# Patient Record
Sex: Female | Born: 1995 | Hispanic: Yes | Marital: Single | Smoking: Never smoker
Health system: Southern US, Community
[De-identification: ages and names within clinical notes are randomized; demographics above are authoritative.]

## PROBLEM LIST (undated history)

## (undated) HISTORY — PX: NO PAST SURGERIES: SHX2092

---

## 2019-05-20 ENCOUNTER — Other Ambulatory Visit: Payer: Self-pay

## 2019-05-20 DIAGNOSIS — Z20822 Contact with and (suspected) exposure to covid-19: Secondary | ICD-10-CM

## 2019-05-22 LAB — NOVEL CORONAVIRUS, NAA: SARS-CoV-2, NAA: NOT DETECTED

## 2021-08-30 ENCOUNTER — Encounter (HOSPITAL_BASED_OUTPATIENT_CLINIC_OR_DEPARTMENT_OTHER): Payer: Self-pay | Admitting: Urology

## 2021-08-30 ENCOUNTER — Other Ambulatory Visit: Payer: Self-pay

## 2021-08-30 ENCOUNTER — Emergency Department (HOSPITAL_BASED_OUTPATIENT_CLINIC_OR_DEPARTMENT_OTHER): Payer: Self-pay

## 2021-08-30 ENCOUNTER — Emergency Department (HOSPITAL_BASED_OUTPATIENT_CLINIC_OR_DEPARTMENT_OTHER)
Admission: EM | Admit: 2021-08-30 | Discharge: 2021-08-30 | Disposition: A | Discharge status: Home / Self Care | Payer: Self-pay | Attending: Emergency Medicine | Admitting: Emergency Medicine

## 2021-08-30 DIAGNOSIS — J069 Acute upper respiratory infection, unspecified: Secondary | ICD-10-CM | POA: Insufficient documentation

## 2021-08-30 DIAGNOSIS — Z20822 Contact with and (suspected) exposure to covid-19: Secondary | ICD-10-CM | POA: Insufficient documentation

## 2021-08-30 DIAGNOSIS — R091 Pleurisy: Secondary | ICD-10-CM | POA: Insufficient documentation

## 2021-08-30 LAB — BASIC METABOLIC PANEL
Anion gap: 8 (ref 5–15)
BUN: 9 mg/dL (ref 6–20)
CO2: 22 mmol/L (ref 22–32)
Calcium: 9.3 mg/dL (ref 8.9–10.3)
Chloride: 104 mmol/L (ref 98–111)
Creatinine, Ser: 0.83 mg/dL (ref 0.44–1.00)
GFR, Estimated: >60 mL/min (ref >60)
Glucose, Bld: 117 mg/dL — ABNORMAL HIGH (ref 70–99)
Potassium: 4.1 mmol/L (ref 3.5–5.1)
Sodium: 134 mmol/L — ABNORMAL LOW (ref 135–145)

## 2021-08-30 LAB — CBC
HCT: 41.8 % (ref 36.0–46.0)
Hemoglobin: 14.2 g/dL (ref 12.0–15.0)
MCH: 31.3 pg (ref 26.0–34.0)
MCHC: 34 g/dL (ref 30.0–36.0)
MCV: 92.3 fL (ref 80.0–100.0)
Platelets: 274 10*3/uL (ref 150–400)
RBC: 4.53 MIL/uL (ref 3.87–5.11)
RDW: 12.6 % (ref 11.5–15.5)
WBC: 7.7 10*3/uL (ref 4.0–10.5)
nRBC: 0 % (ref 0.0–0.2)

## 2021-08-30 LAB — TROPONIN I (HIGH SENSITIVITY): Troponin I (High Sensitivity): <2 ng/L (ref <18)

## 2021-08-30 LAB — PREGNANCY, URINE: Preg Test, Ur: NEGATIVE

## 2021-08-30 LAB — RESP PANEL BY RT-PCR (FLU A&B, COVID) ARPGX2
Influenza A by PCR: NEGATIVE
Influenza B by PCR: NEGATIVE
SARS Coronavirus 2 by RT PCR: NEGATIVE

## 2021-08-30 MED ORDER — HYDROCODONE BIT-HOMATROP MBR 5-1.5 MG/5ML PO SOLN: 5.0000 mL | Freq: Four times a day (QID) | ORAL | 0 refills | Status: DC | PRN

## 2021-08-30 NOTE — ED Notes (Signed)
Pt in bed, pt states that she is ready to go home, pt verbalized understanding d/c instructions and follow up.

## 2021-08-30 NOTE — ED Triage Notes (Signed)
Chest pain that stated yesterday SOB since Sunday  Productive cough since Sunday  Denies fever

## 2021-08-30 NOTE — Discharge Instructions (Addendum)
As we discussed your symptoms are consistent with upper respiratory infection with pain coming from your cough, and lung inflammation.  There is no evidence of damage to your heart, pneumonia, or other abnormalities in your work-up today.  Your symptoms should improve with rest, ibuprofen and Tylenol for pain, cough syrup, plenty of fluids, and rest.  Please use Tylenol or ibuprofen for pain.  You may use 600 mg ibuprofen every 6 hours or 1000 mg of Tylenol every 6 hours.  You may choose to alternate between the 2.  This would be most effective.  Not to exceed 4 g of Tylenol within 24 hours.  Not to exceed 3200 mg ibuprofen 24 hours.

## 2021-08-30 NOTE — ED Provider Notes (Signed)
[facility] EMERGENCY DEPARTMENT Provider Note   CSN: [id number] Arrival date & time: 08/30/21  1652     History  Chief Complaint  Patient presents with   Chest Pain   Shortness of Breath    Andrea Hogan is a 26 y.o. female with no significant past medical history who presents with complaint of cough, sore throat, chest pain, feeling shortness of breath since Sunday.  Patient reports that she has had a productive cough since Sunday.  She denies any fever, nausea, vomiting.  Patient endorses first-degree family history of ACS.  Patient denies tobacco use, hypertension, hyperlipidemia, diabetes.  Patient denies history of same symptoms.  No personal history of stroke, ACS.  Patient reports that chest pain feels like a pressure on her chest, has been consistent for the last 2 days, worse with cough.  Patient has been taking Sudafed, other over-the-counter cough medication with minimal relief.   Chest Pain Associated symptoms: shortness of breath   Shortness of Breath Associated symptoms: chest pain       Home Medications Prior to Admission medications   Medication Sig Start Date End Date Taking? Authorizing Provider  HYDROcodone bit-homatropine (HYCODAN) 5-1.5 MG/5ML syrup Take 5 mLs by mouth every 6 (six) hours as needed for cough. 08/30/21  Yes Andrea Hogan H, PA-C      Allergies    Penicillins    Review of Systems   Review of Systems  Respiratory:  Positive for chest tightness and shortness of breath.   Cardiovascular:  Positive for chest pain.  All other systems reviewed and are negative.  Physical Exam Updated Vital Signs BP 115/78 (BP Location: Right Arm)    Pulse 88    Temp 98.9 F (37.2 C) (Oral)    Resp 20    Ht 5\' 3"  (1.6 m)    Wt 56.7 kg    LMP 08/07/2021 (Approximate)    SpO2 100%    BMI 22.14 kg/m  Physical Exam Vitals and nursing note reviewed.  Constitutional:      General: She is not in acute distress.    Appearance: Normal appearance.   HENT:     Head: Normocephalic and atraumatic.  Eyes:     General:        Right eye: No discharge.        Left eye: No discharge.  Cardiovascular:     Rate and Rhythm: Normal rate and regular rhythm.     Heart sounds: No murmur heard.   No friction rub. No gallop.  Pulmonary:     Effort: Pulmonary effort is normal.     Breath sounds: Normal breath sounds.     Comments: Clear breath sounds throughout, no wheezing, no stridor, no rhonchi, no rales Chest:     Comments: Very minimal tenderness palpation central chest. Abdominal:     General: Bowel sounds are normal.     Palpations: Abdomen is soft.  Skin:    General: Skin is warm and dry.     Capillary Refill: Capillary refill takes less than 2 seconds.  Neurological:     Mental Status: She is alert and oriented to person, place, and time.  Psychiatric:        Mood and Affect: Mood normal.        Behavior: Behavior normal.    ED Results / Procedures / Treatments   Labs (all labs ordered are listed, but only abnormal results are displayed) Labs Reviewed  BASIC METABOLIC PANEL - Abnormal; Notable for the  following components:      Result Value   Sodium 134 (*)    Glucose, Bld 117 (*)    All other components within normal limits  RESP PANEL BY RT-PCR (FLU A&B, COVID) [id number]  CBC  PREGNANCY, URINE  TROPONIN I (HIGH SENSITIVITY)    EKG EKG Interpretation  Date/Time:  Tuesday August 30 2021 17:06:02 EST Ventricular Rate:  88 PR Interval:  158 QRS Duration: 84 QT Interval:  356 QTC Calculation: 430 R Axis:   101 Text Interpretation: Normal sinus rhythm Rightward axis Borderline ECG No previous ECGs available Confirmed by Aletta Edouard [phone]) on 08/30/2021 5:16:32 PM  Radiology DG Chest 2 View  Result Date: 08/30/2021 CLINICAL DATA:  Chest pain and shortness of breath EXAM: CHEST - 2 VIEW COMPARISON:  None. FINDINGS: The heart size and mediastinal contours are within normal limits. No focal consolidation. No pleural  effusion. No pneumothorax. The visualized skeletal structures are unremarkable. Left breast ornamentation device. IMPRESSION: No active cardiopulmonary disease. Electronically Signed   By: Dahlia Bailiff M.D.   On: 08/30/2021 17:28    Procedures Procedures    Medications Ordered in ED Medications - No data to display  ED Course/ Medical Decision Making/ A&P                           Medical Decision Making Amount and/or Complexity of Data Reviewed Labs: ordered. Radiology: ordered.  Risk Prescription drug management.   Given the large differential diagnosis for [facility], the decision making in this case is of high complexity.  After evaluating all of the data points in this case, the presentation of Andrea Hogan is NOT consistent with Acute Coronary Syndrome (ACS) and/or myocardial ischemia, pulmonary embolism, aortic dissection; Borhaave's, significant arrythmia, pneumothorax, cardiac tamponade, or other emergent cardiopulmonary condition.  Further, the presentation of Andrea Hogan is NOT consistent with pericarditis, myocarditis, cholecystitis, pancreatitis, mediastinitis, endocarditis, new valvular disease.  Additionally, the presentation of Andrea Hogan is NOT consistent with flail chest, cardiac contusion, ARDS, or significant intra-thoracic or intra-abdominal bleeding.  Moreover, this presentation is NOT consistent with pneumonia, sepsis, or pyelonephritis.  The patient has a heart score of 2, negative troponin x1 in context of chest pain has been going on for 6 hours.  Patient has alternative likely diagnosis of upper respiratory infectious symptoms with cough, pleurisy, or chest pain from coughing.  Her EKG shows an underlying rhythm of sinus rhythm, mild with right axis deviation, no evidence of ischemia.  My attending Dr. Melina Copa independently reviewed these EKG findings, agrees with this interpretation.  Independently interpreted the chest x-ray which shows  no evidence of acute cardiopulmonary disease.  A cath radiology interpretation.  I ordered and reviewed lab work including RVP, troponin, CBC, BMP, pregnancy test, the results are overall unremarkable.  She has a very mild hyponatremia, very mild hyperglycemia.  Patient symptoms are consistent with upper respiratory infection, pleurisy, believe she will benefit from supportive care, Hycodan, rest, and close follow-up with PCP.  Patient understands and agrees to this plan.  Strict return and follow-up precautions have been given by me personally or by detailed written instruction given verbally by nursing staff using the teach back method to the patient/family/caregiver(s).  Data Reviewed/Counseling: I have reviewed the patient's vital signs, nursing notes, and other relevant tests/information. I had a detailed discussion regarding the historical points, exam findings, and any diagnostic results supporting the discharge diagnosis. I also discussed the need for outpatient follow-up  and the need to return to the ED if symptoms worsen or if there are any questions or concerns that arise at home.  Final Clinical Impression(s) / ED Diagnoses Final diagnoses:  Viral upper respiratory tract infection  Pleurisy    Rx / DC Orders ED Discharge Orders          Ordered    HYDROcodone bit-homatropine (HYCODAN) 5-1.5 MG/5ML syrup  Every 6 hours PRN        08/30/21 1848              Anselmo Pickler, PA-C 08/30/21 1901    Hayden Rasmussen, MD 08/31/21 [phone]

## 2021-11-28 ENCOUNTER — Emergency Department (HOSPITAL_BASED_OUTPATIENT_CLINIC_OR_DEPARTMENT_OTHER): Payer: Self-pay

## 2021-11-28 ENCOUNTER — Other Ambulatory Visit: Payer: Self-pay

## 2021-11-28 ENCOUNTER — Emergency Department (HOSPITAL_BASED_OUTPATIENT_CLINIC_OR_DEPARTMENT_OTHER)
Admission: EM | Admit: 2021-11-28 | Discharge: 2021-11-29 | Disposition: A | Discharge status: Home / Self Care | Payer: Self-pay | Attending: Emergency Medicine | Admitting: Emergency Medicine

## 2021-11-28 ENCOUNTER — Encounter (HOSPITAL_BASED_OUTPATIENT_CLINIC_OR_DEPARTMENT_OTHER): Payer: Self-pay | Admitting: Emergency Medicine

## 2021-11-28 DIAGNOSIS — R109 Unspecified abdominal pain: Secondary | ICD-10-CM | POA: Insufficient documentation

## 2021-11-28 DIAGNOSIS — O039 Complete or unspecified spontaneous abortion without complication: Secondary | ICD-10-CM | POA: Insufficient documentation

## 2021-11-28 DIAGNOSIS — Z3A22 22 weeks gestation of pregnancy: Secondary | ICD-10-CM | POA: Insufficient documentation

## 2021-11-28 DIAGNOSIS — R112 Nausea with vomiting, unspecified: Secondary | ICD-10-CM

## 2021-11-28 DIAGNOSIS — O219 Vomiting of pregnancy, unspecified: Secondary | ICD-10-CM | POA: Insufficient documentation

## 2021-11-28 LAB — CBC WITH DIFFERENTIAL/PLATELET
Abs Immature Granulocytes: 0.03 10*3/uL (ref 0.00–0.07)
Basophils Absolute: 0 10*3/uL (ref 0.0–0.1)
Basophils Relative: 0 %
Eosinophils Absolute: 0 10*3/uL (ref 0.0–0.5)
Eosinophils Relative: 0 %
HCT: 35.9 % — ABNORMAL LOW (ref 36.0–46.0)
Hemoglobin: 12.5 g/dL (ref 12.0–15.0)
Immature Granulocytes: 0 %
Lymphocytes Relative: 18 %
Lymphs Abs: 1.7 10*3/uL (ref 0.7–4.0)
MCH: 31.6 pg (ref 26.0–34.0)
MCHC: 34.8 g/dL (ref 30.0–36.0)
MCV: 90.7 fL (ref 80.0–100.0)
Monocytes Absolute: 0.8 10*3/uL (ref 0.1–1.0)
Monocytes Relative: 8 %
Neutro Abs: 7.2 10*3/uL (ref 1.7–7.7)
Neutrophils Relative %: 74 %
Platelets: 249 10*3/uL (ref 150–400)
RBC: 3.96 MIL/uL (ref 3.87–5.11)
RDW: 12.7 % (ref 11.5–15.5)
WBC: 9.7 10*3/uL (ref 4.0–10.5)
nRBC: 0 % (ref 0.0–0.2)

## 2021-11-28 LAB — BASIC METABOLIC PANEL
Anion gap: 7 (ref 5–15)
BUN: 9 mg/dL (ref 6–20)
CO2: 22 mmol/L (ref 22–32)
Calcium: 8.7 mg/dL — ABNORMAL LOW (ref 8.9–10.3)
Chloride: 105 mmol/L (ref 98–111)
Creatinine, Ser: 0.55 mg/dL (ref 0.44–1.00)
GFR, Estimated: >60 mL/min (ref >60)
Glucose, Bld: 97 mg/dL (ref 70–99)
Potassium: 3.6 mmol/L (ref 3.5–5.1)
Sodium: 134 mmol/L — ABNORMAL LOW (ref 135–145)

## 2021-11-28 LAB — URINALYSIS, ROUTINE W REFLEX MICROSCOPIC
Bilirubin Urine: NEGATIVE
Glucose, UA: NEGATIVE mg/dL
Hgb urine dipstick: NEGATIVE
Ketones, ur: NEGATIVE mg/dL
Leukocytes,Ua: NEGATIVE
Nitrite: NEGATIVE
Protein, ur: NEGATIVE mg/dL
Specific Gravity, Urine: 1.025 (ref 1.005–1.030)
pH: 7 (ref 5.0–8.0)

## 2021-11-28 LAB — HCG, QUANTITATIVE, PREGNANCY: hCG, Beta Chain, Quant, S: 152073 m[IU]/mL — ABNORMAL HIGH (ref <5)

## 2021-11-28 MED ORDER — UNISOM SLEEPTABS 25 MG PO TABS: 12.5000 mg | ORAL_TABLET | Freq: Every evening | ORAL | 0 refills | Status: DC | PRN

## 2021-11-28 MED ORDER — SODIUM CHLORIDE 0.9 % IV BOLUS
1000.0000 mL | Freq: Once | INTRAVENOUS | Status: AC
  Administered 2021-11-28: 1000 mL via INTRAVENOUS

## 2021-11-28 MED ORDER — ONDANSETRON HCL 4 MG/2ML IJ SOLN
4.0000 mg | Freq: Once | INTRAMUSCULAR | Status: AC
  Administered 2021-11-28: 4 mg via INTRAVENOUS
  Filled 2021-11-28: qty 2

## 2021-11-28 MED ORDER — VITAMIN B-6 25 MG PO TABS: 12.5000 mg | ORAL_TABLET | Freq: Two times a day (BID) | ORAL | 0 refills | Status: DC | PRN

## 2021-11-28 MED ORDER — ONDANSETRON HCL 4 MG PO TABS: 4.0000 mg | ORAL_TABLET | Freq: Four times a day (QID) | ORAL | 0 refills | Status: DC

## 2021-11-28 MED ORDER — ACETAMINOPHEN 325 MG PO TABS
650.0000 mg | ORAL_TABLET | Freq: Once | ORAL | Status: AC
  Administered 2021-11-28: 650 mg via ORAL
  Filled 2021-11-28: qty 2

## 2021-11-28 MED ORDER — DOXYLAMINE SUCCINATE (SLEEP) 25 MG PO TABS
12.5000 mg | ORAL_TABLET | ORAL | Status: DC
Start: 2021-11-28 — End: 2021-11-29
  Filled 2021-11-28: qty 1

## 2021-11-28 MED ORDER — PYRIDOXINE HCL 25 MG PO TABS
25.0000 mg | ORAL_TABLET | Freq: Every day | ORAL | Status: DC
  Filled 2021-11-28: qty 1

## 2021-11-28 NOTE — ED Provider Notes (Signed)
?[facility] EMERGENCY DEPARTMENT ?Provider Note ? ? ?CSN: [id number] ?Arrival date & time: 11/28/21  1754 ? ?  ? ?History ? ?Chief Complaint  ?Patient presents with  ? Emesis During Pregnancy  ? ? ?Andrea Hogan is a 26 y.o. female. ? ?Patient is a 26 yo female G1P1 presenting for abdominal pain. Pt admits to suprapubic abdominal pain with radiation to the back x 3 days. Denies dysuria, hematuria, or increased frequency. Pt also admits nausea and vomiting 5-10 x a day for the last 3 weeks. Denies leakage fluids. No vaginal bleeding. No changes in discharge.  ? ?The history is provided by the patient. No language interpreter was used.  ? ?  ? ?Home Medications ?Prior to Admission medications   ?Medication Sig Start Date End Date Taking? Authorizing Provider  ?doxylamine, Sleep, (UNISOM SLEEPTABS) 25 MG tablet Take 0.5 tablets (12.5 mg total) by mouth at bedtime as needed. [id number]  Yes Campbell Stall P, DO  ?ondansetron (ZOFRAN) 4 MG tablet Take 1 tablet (4 mg total) by mouth every 6 (six) hours. [id number]  Yes Campbell Stall P, DO  ?vitamin B-6 (PYRIDOXINE) 25 MG tablet Take 0.5 tablets (12.5 mg total) by mouth 2 (two) times daily as needed (nausea, vomiting). [id number]  Yes Campbell Stall P, DO  ?HYDROcodone bit-homatropine (HYCODAN) 5-1.5 MG/5ML syrup Take 5 mLs by mouth every 6 (six) hours as needed for cough. 08/30/21   Prosperi, Darrick Meigs H, PA-C  ?   ? ?Allergies    ?Penicillins   ? ?Review of Systems   ?Review of Systems  ?Constitutional:  Negative for chills and fever.  ?HENT:  Negative for ear pain and sore throat.   ?Eyes:  Negative for pain and visual disturbance.  ?Respiratory:  Negative for cough and shortness of breath.   ?Cardiovascular:  Negative for chest pain and palpitations.  ?Gastrointestinal:  Positive for abdominal pain, nausea and vomiting.  ?Genitourinary:  Negative for dysuria and hematuria.  ?Musculoskeletal:  Positive for back pain. Negative for arthralgias.  ?Skin:  Negative for color change  and rash.  ?Neurological:  Negative for seizures and syncope.  ?All other systems reviewed and are negative. ? ?Physical Exam ?Updated Vital Signs ?BP 100/68   Pulse 77   Temp 98.4 ?F (36.9 ?C) (Oral)   Resp 17   Ht 5\' 3"  (1.6 m)   Wt 59 kg   LMP 09/29/2021 (Exact Date)   SpO2 100%   BMI 23.03 kg/m?  ?Physical Exam ?Vitals and nursing note reviewed.  ?Constitutional:   ?   General: She is not in acute distress. ?   Appearance: She is well-developed.  ?HENT:  ?   Head: Normocephalic and atraumatic.  ?Eyes:  ?   Conjunctiva/sclera: Conjunctivae normal.  ?Cardiovascular:  ?   Rate and Rhythm: Normal rate and regular rhythm.  ?   Heart sounds: No murmur heard. ?Pulmonary:  ?   Effort: Pulmonary effort is normal. No respiratory distress.  ?   Breath sounds: Normal breath sounds.  ?Abdominal:  ?   Palpations: Abdomen is soft.  ?   Tenderness: There is no abdominal tenderness.  ?Musculoskeletal:     ?   General: No swelling.  ?   Cervical back: Neck supple.  ?Skin: ?   General: Skin is warm and dry.  ?   Capillary Refill: Capillary refill takes less than 2 seconds.  ?Neurological:  ?   Mental Status: She is alert.  ?Psychiatric:     ?   Mood and  Affect: Mood normal.  ? ? ?ED Results / Procedures / Treatments   ?Labs ?(all labs ordered are listed, but only abnormal results are displayed) ?Labs Reviewed  ?CBC WITH DIFFERENTIAL/PLATELET - Abnormal; Notable for the following components:  ?    Result Value  ? HCT 35.9 (*)   ? All other components within normal limits  ?BASIC METABOLIC PANEL - Abnormal; Notable for the following components:  ? Sodium 134 (*)   ? Calcium 8.7 (*)   ? All other components within normal limits  ?HCG, QUANTITATIVE, PREGNANCY - Abnormal; Notable for the following components:  ? hCG, Beta Chain, Quant, S 152,073 (*)   ? All other components within normal limits  ?URINE CULTURE  ?URINALYSIS, ROUTINE W REFLEX MICROSCOPIC  ? ? ?EKG ?None ? ?Radiology ?US OB LESS THAN 14 WEEKS WITH OB  TRANSVAGINAL ? ?Result Date: 11/28/2021 ?CLINICAL DATA:  Abdominal pain EXAM: OBSTETRIC <14 WK [place] AND TRANSVAGINAL OB US TECHNIQUE: Both transabdominal and transvaginal ultrasound examinations were performed for complete evaluation of the gestation as well as the maternal uterus, adnexal regions, and pelvic cul-de-sac. Transvaginal technique was performed to assess early pregnancy. COMPARISON:  None Available. FINDINGS: Intrauterine gestational sac: Single Yolk sac:  Visualized. Embryo:  Visualized. Cardiac Activity: Not Visualized. Heart Rate:   bpm MSD:   mm    w     d CRL:  15.6 mm   8 w   0 d                  [place] EDC: 07/10/2022 Subchorionic hemorrhage:  Small subchorionic hemorrhage. Maternal uterus/adnexae: No adnexal mass or free fluid. IMPRESSION: Eight week intrauterine pregnancy. No fetal heart tones detected. Findings meet definitive criteria for failed pregnancy. This follows SRU consensus guidelines: Diagnostic Criteria for Nonviable Pregnancy Early in the First Trimester. Alison Stalling J Med [id number]. Electronically Signed   By: Rolm Baptise M.D.   On: 11/28/2021 23:36   ? ?Procedures ?Procedures  ? ? ?Medications Ordered in ED ?Medications  ?pyridOXINE (VITAMIN B-6) tablet 25 mg (25 mg Oral Not Given 11/28/21 2020)  ?doxylamine (Sleep) (UNISOM) tablet 12.5 mg (12.5 mg Oral Not Given 11/28/21 2019)  ?sodium chloride 0.9 % bolus 1,000 mL (0 mLs Intravenous Stopped 11/28/21 2126)  ?ondansetron [facility]) injection 4 mg (4 mg Intravenous Given 11/28/21 2014)  ?acetaminophen (TYLENOL) tablet 650 mg (650 mg Oral Given 11/28/21 2026)  ? ? ?ED Course/ Medical Decision Making/ A&P ?  ?                        ?Medical Decision Making ?Amount and/or Complexity of Data Reviewed ?Labs: ordered. ?Radiology: ordered. ? ?Risk ?OTC drugs. ?Prescription drug management. ? ? ?11:45 PM ?Patient is a 26 year old female, G1, P1, presenting for nausea, vomiting, lower abdominal tightening, and back pain proximately 2 months pregnant.  Is  alert and oriented x3, no acute distress, afebrile, stable vitals.  Abdomen is soft with minimal tenderness to palpation of suprapubic region. ? ?Beta-hCG 152,073 ?UA demonstrates no hematuria.  No UTI.   ?IV fluids and Zofran given for nausea and vomiting.  Stable electrolytes.  ? ?Transvaginal ultrasound demonstrates IUP approximately 8 weeks without fetal heart tones. Findings explained to patient in detail with recommendations for prompt follow up with OBGYN. Unable to perform RH testing at our facility. Pt recommended for testing.  ? ? ?Symptoms likely secondary to first trimester nausea, vomiting, and dehydration.  Patient in no distress and overall  condition improved here in the ED. Detailed discussions were had with the patient regarding current findings, and need for close f/u with OB/GYN.  The patient has been instructed to return immediately if the symptoms worsen in any way for re-evaluation. Patient verbalized understanding and is in agreement with current care plan. All questions answered prior to discharge. ? ? ? ? ? ? ? ?Final Clinical Impression(s) / ED Diagnoses ?Final diagnoses:  ?Nausea and vomiting in pregnancy prior to [redacted] weeks gestation  ?Miscarriage  ? ? ?Rx / DC Orders ?ED Discharge Orders   ? ?      Ordered  ?  doxylamine, Sleep, (UNISOM SLEEPTABS) 25 MG tablet  At bedtime PRN       ? 11/28/21 2154  ?  vitamin B-6 (PYRIDOXINE) 25 MG tablet  2 times daily PRN       ? 11/28/21 2154  ?  ondansetron (ZOFRAN) 4 MG tablet  Every 6 hours       ? 11/28/21 2154  ? ?  ?  ? ?  ? ? ?  ?Lianne Cure, DO ?[id number] ? ?

## 2021-11-28 NOTE — ED Notes (Signed)
Went in with ultrasound tech to chaperone a transvaginal ultrasound. ?

## 2021-11-28 NOTE — ED Triage Notes (Signed)
Pt [redacted] weeks pregnant and for past 3 days had nausea, vomiting, back pain and bilateral leg pain. Denies any injury, denies any bleeding.  ?

## 2021-11-29 ENCOUNTER — Encounter: Payer: Self-pay | Admitting: Family Medicine

## 2021-11-29 ENCOUNTER — Ambulatory Visit (INDEPENDENT_AMBULATORY_CARE_PROVIDER_SITE_OTHER): Payer: Self-pay | Admitting: Family Medicine

## 2021-11-29 VITALS — BP 116/73 | HR 90 | Wt 127.6 lb

## 2021-11-29 DIAGNOSIS — O0289 Other abnormal products of conception: Secondary | ICD-10-CM | POA: Insufficient documentation

## 2021-11-29 MED ORDER — MISOPROSTOL 200 MCG PO TABS: 800.0000 ug | ORAL_TABLET | Freq: Once | ORAL | 0 refills | Status: DC

## 2021-11-29 MED ORDER — OXYCODONE HCL 5 MG PO TABS: 5.0000 mg | ORAL_TABLET | ORAL | 0 refills | Status: DC | PRN

## 2021-11-29 MED ORDER — ONDANSETRON 4 MG PO TBDP: 4.0000 mg | ORAL_TABLET | Freq: Three times a day (TID) | ORAL | 0 refills | Status: DC | PRN

## 2021-11-29 NOTE — Progress Notes (Signed)
Positive GAD7 screening, referral placed for IBH services ? ?Wynona Canes, CMA ? ?

## 2021-11-29 NOTE — Progress Notes (Signed)
? ?GYNECOLOGY OFFICE VISIT NOTE ? ?History:  ? Andrea Hogan is a 26 y.o. G1P0010 here today for nonviable pregnancy follow up. ? ?[facility] ?At that time diagnosed with non-viable pregnancy ?No Rh obtained or sent out ? ?Today reports still having significant belly and back pain, didn't think she could take any pain medicine for it based on her conversation with doctor last night ?No bleeding ?Very upset and has many questions ? ?Health Maintenance Due  ?Topic Date Due  ? COVID-19 Vaccine (1) Never done  ? HPV VACCINES (1 - 2-dose series) Never done  ? HIV Screening  Never done  ? Hepatitis C Screening  Never done  ? TETANUS/TDAP  Never done  ? PAP-Cervical Cytology Screening  Never done  ? PAP SMEAR-Modifier  Never done  ? ? ?History reviewed. No pertinent past medical history. ? ?History reviewed. No pertinent surgical history. ? ?The following portions of the patient's history were reviewed and updated as appropriate: allergies, current medications, past family history, past medical history, past social history, past surgical history and problem list.  ? ?Health Maintenance:   ?Last pap: ?No results found for: DIAGPAP, HPV, HPVHIGH ?*needs* ? ?Last mammogram:  ?N/a  ? ?Review of Systems:  ?Pertinent items noted in HPI and remainder of comprehensive ROS otherwise negative. ? ?Physical Exam:  ?BP 116/73   Pulse 90   Wt 127 lb 9.6 oz (57.9 kg)   BMI 22.60 kg/m?  ?CONSTITUTIONAL: Well-developed, well-nourished female in no acute distress.  ?HEENT:  Normocephalic, atraumatic. External right and left ear normal. No scleral icterus.  ?NECK: Normal range of motion, supple, no masses noted on observation ?SKIN: No rash noted. Not diaphoretic. No erythema. No pallor. ?MUSCULOSKELETAL: Normal range of motion. No edema noted. ?NEUROLOGIC: Alert and oriented to person, place, and time. Normal muscle tone coordination.  ?PSYCHIATRIC: depressed mood and affect ?RESPIRATORY: Effort  normal, no problems with respiration noted ? ?Labs and Imaging ?Results for orders placed or performed during the hospital encounter of 11/28/21 (from the past 168 hour(s))  ?Urinalysis, Routine w reflex microscopic Urine, Clean Catch  ? Collection Time: 11/28/21  8:03 PM  ?Result Value Ref Range  ? Color, Urine YELLOW YELLOW  ? APPearance CLEAR CLEAR  ? Specific Gravity, Urine 1.025 1.005 - 1.030  ? pH 7.0 5.0 - 8.0  ? Glucose, UA NEGATIVE NEGATIVE mg/dL  ? Hgb urine dipstick NEGATIVE NEGATIVE  ? Bilirubin Urine NEGATIVE NEGATIVE  ? Ketones, ur NEGATIVE NEGATIVE mg/dL  ? Protein, ur NEGATIVE NEGATIVE mg/dL  ? Nitrite NEGATIVE NEGATIVE  ? Leukocytes,Ua NEGATIVE NEGATIVE  ?CBC with Differential  ? Collection Time: 11/28/21  8:05 PM  ?Result Value Ref Range  ? WBC 9.7 4.0 - 10.5 K/uL  ? RBC 3.96 3.87 - 5.11 MIL/uL  ? Hemoglobin 12.5 12.0 - 15.0 g/dL  ? HCT 35.9 (L) 36.0 - 46.0 %  ? MCV 90.7 80.0 - 100.0 fL  ? MCH 31.6 26.0 - 34.0 pg  ? MCHC 34.8 30.0 - 36.0 g/dL  ? RDW 12.7 11.5 - 15.5 %  ? Platelets 249 150 - 400 K/uL  ? nRBC 0.0 0.0 - 0.2 %  ? Neutrophils Relative % 74 %  ? Neutro Abs 7.2 1.7 - 7.7 K/uL  ? Lymphocytes Relative 18 %  ? Lymphs Abs 1.7 0.7 - 4.0 K/uL  ? Monocytes Relative 8 %  ? Monocytes Absolute 0.8 0.1 - 1.0 K/uL  ? Eosinophils Relative 0 %  ?  Eosinophils Absolute 0.0 0.0 - 0.5 K/uL  ? Basophils Relative 0 %  ? Basophils Absolute 0.0 0.0 - 0.1 K/uL  ? Immature Granulocytes 0 %  ? Abs Immature Granulocytes 0.03 0.00 - 0.07 K/uL  ?Basic metabolic panel  ? Collection Time: 11/28/21  8:05 PM  ?Result Value Ref Range  ? Sodium 134 (L) 135 - 145 mmol/L  ? Potassium 3.6 3.5 - 5.1 mmol/L  ? Chloride 105 98 - 111 mmol/L  ? CO2 22 22 - 32 mmol/L  ? Glucose, Bld 97 70 - 99 mg/dL  ? BUN 9 6 - 20 mg/dL  ? Creatinine, Ser 0.55 0.44 - 1.00 mg/dL  ? Calcium 8.7 (L) 8.9 - 10.3 mg/dL  ? GFR, Estimated >60 >60 mL/min  ? Anion gap 7 5 - 15  ?hCG, quantitative, pregnancy  ? Collection Time: 11/28/21  8:05 PM  ?Result  Value Ref Range  ? hCG, Beta Chain, Quant, S 152,073 (H) <5 mIU/mL  ? ?US OB LESS THAN 14 WEEKS WITH OB TRANSVAGINAL ? ?Result Date: 11/28/2021 ?CLINICAL DATA:  Abdominal pain EXAM: OBSTETRIC <14 WK [place] AND TRANSVAGINAL OB US TECHNIQUE: Both transabdominal and transvaginal ultrasound examinations were performed for complete evaluation of the gestation as well as the maternal uterus, adnexal regions, and pelvic cul-de-sac. Transvaginal technique was performed to assess early pregnancy. COMPARISON:  None Available. FINDINGS: Intrauterine gestational sac: Single Yolk sac:  Visualized. Embryo:  Visualized. Cardiac Activity: Not Visualized. Heart Rate:   bpm MSD:   mm    w     d CRL:  15.6 mm   8 w   0 d                  [place] EDC: 07/10/2022 Subchorionic hemorrhage:  Small subchorionic hemorrhage. Maternal uterus/adnexae: No adnexal mass or free fluid. IMPRESSION: Eight week intrauterine pregnancy. No fetal heart tones detected. Findings meet definitive criteria for failed pregnancy. This follows SRU consensus guidelines: Diagnostic Criteria for Nonviable Pregnancy Early in the First Trimester. Macy Mis J Med [phone]. Electronically Signed   By: Charlett Nose M.D.   On: 11/28/2021 23:36      ?Assessment and Plan:  ? ?Problem List Items Addressed This Visit   ? ?  ? Other  ? Nonviable pregnancy - Primary  ?  Reassured patient that miscarriage is common with ~1/4 of women experiencing it in their lifetime. Reassured patient that there is nothing she did or did not do to cause this. Reviewed most common reason is presumed to be genetic abnormalities that allow a pregnancy to start but not continue past an early stage, but realistically we do not know the cause in most cases. Reviewed that studies show no definite difference between attempting another pregnancy sooner vs waiting, though some studies do show better live birth outcomes with trying sooner. Reviewed options of expectant, medical, or surgical management. After  counseling she was not sure yet what she wanted to do but was ok with me sending medications for medical management. Rx sent for misoprostol 800 mcg buccal, zofran ODT, and 6 tabs of 5mg  Oxycodone (PDMP reviewed). Reviewed that cramping, bleeding are normal in the first few hours after taking the medication, but should eventually wane. Reviewed warning signs of heavy vaginal bleeding soaking through >1 pad per hour, crescendo abdominal pain, and fever. . Blood type unknown, Abo/Rh sent at this visit. Interested in birth control but not at this time. Declines BH referral at this time.  ? ?Also noted  no pap on file, she reports she had one recently at health department in [facility]. ? ?Will follow up in 1 month to discuss contraception and see how she is doing.  ? ?  ?  ? Relevant Medications  ? misoprostol (CYTOTEC) 200 MCG tablet  ? ondansetron (ZOFRAN-ODT) 4 MG disintegrating tablet  ? oxyCODONE (ROXICODONE) 5 MG immediate release tablet  ? Other Relevant Orders  ? ABO AND RH   ? ? ?Routine preventative health maintenance measures emphasized. ?Please refer to After Visit Summary for other counseling recommendations.  ? ?Return in about 4 weeks (around 12/27/2021) for miscarriage follow up.   ? ?Total face-to-face time with patient: 20 minutes.  Over 50% of encounter was spent on counseling and coordination of care. ? ? ?Venora Maples, MD/MPH ?Attending Family Medicine Physician, Faculty Practice ?Center for [employer], [facility] ? ?

## 2021-11-29 NOTE — Assessment & Plan Note (Addendum)
Reassured patient that miscarriage is common with ~1/4 of women experiencing it in their lifetime. Reassured patient that there is nothing she did or did not do to cause this. Reviewed most common reason is presumed to be genetic abnormalities that allow a pregnancy to start but not continue past an early stage, but realistically we do not know the cause in most cases. Reviewed that studies show no definite difference between attempting another pregnancy sooner vs waiting, though some studies do show better live birth outcomes with trying sooner. Reviewed options of expectant, medical, or surgical management. After counseling she was not sure yet what she wanted to do but was ok with me sending medications for medical management. Rx sent for misoprostol 800 mcg buccal, zofran ODT, and 6 tabs of 5mg  Oxycodone (PDMP reviewed). Reviewed that cramping, bleeding are normal in the first few hours after taking the medication, but should eventually wane. Reviewed warning signs of heavy vaginal bleeding soaking through >1 pad per hour, crescendo abdominal pain, and fever. . Blood type unknown, Abo/Rh sent at this visit. Interested in birth control but not at this time. Declines BH referral at this time.  ? ?Also noted no pap on file, she reports she had one recently at health department in [facility]. ? ?Will follow up in 1 month to discuss contraception and see how she is doing.  ?

## 2021-11-30 LAB — URINE CULTURE: Special Requests: NORMAL

## 2021-11-30 LAB — ABO AND RH: Rh Factor: POSITIVE

## 2021-12-01 ENCOUNTER — Encounter: Payer: Self-pay | Admitting: Family Medicine

## 2021-12-01 ENCOUNTER — Telehealth: Payer: Self-pay | Admitting: Family Medicine

## 2021-12-01 NOTE — Telephone Encounter (Signed)
Patient is calling inquiring about surgery she spoke about with Dr. Crissie Reese ?

## 2021-12-01 NOTE — Telephone Encounter (Signed)
Received a message from Dr. Crissie Reese that he is reaching out the surgery scheduler to schedule the surgery. Called patient and let her know Dr. Audie Clear response. Reviewed with her that Dr. Crissie Reese has checked and there is no availability for surgery tomorrow so will be next week. Reviewed Surgery Scheduler will be the next person to call her and then the hospital will call her to give her further instructions.  ? ?Patient asked if leaving the pregnancy in the body will be harmful to her, reviewed that usually not and that sometimes the body will expel the pregnancy on its own. ? ?Informed patient she can call with any questions or concerns as needed. Reviewed again that if she is saturating pads or pain increases to go to MAU. Patient voiced understanding.  ?

## 2021-12-01 NOTE — Telephone Encounter (Signed)
Called and spoke with patient. Informed her that message that she sent earlier inquiring about D&C was sent to Dr. Dione Plover and awaiting his response. Reviewed it will have to be sent to Surgery Scheduler to be scheduled and patient will be informed.  ? ?Patient reports she is still hurting but pain is decreasing. She reports she chose not to take the Cytotec. She is not bleeding.  ? ?Reviewed if having severe abdominal pain or saturating a pad an hour for more than 2 hours, she is to go to to MAU for evaluation. Patient voiced understanding.  ? ? ?

## 2021-12-02 ENCOUNTER — Other Ambulatory Visit (HOSPITAL_BASED_OUTPATIENT_CLINIC_OR_DEPARTMENT_OTHER): Payer: Self-pay | Admitting: Obstetrics & Gynecology

## 2021-12-02 ENCOUNTER — Telehealth: Payer: Self-pay

## 2021-12-02 DIAGNOSIS — O021 Missed abortion: Secondary | ICD-10-CM

## 2021-12-02 NOTE — Telephone Encounter (Signed)
Called patient, no answer, left voicemail with surgery date, time, location and preop instructions. 

## 2021-12-05 ENCOUNTER — Telehealth: Payer: Self-pay

## 2021-12-05 ENCOUNTER — Other Ambulatory Visit: Payer: Self-pay

## 2021-12-05 ENCOUNTER — Encounter (HOSPITAL_BASED_OUTPATIENT_CLINIC_OR_DEPARTMENT_OTHER): Payer: Self-pay | Admitting: Obstetrics & Gynecology

## 2021-12-05 NOTE — Telephone Encounter (Signed)
Called patient, no answer, left voicemail with updated information about surgery location change and time change. ?

## 2021-12-05 NOTE — Anesthesia Preprocedure Evaluation (Addendum)
Anesthesia Evaluation  ?Patient identified by MRN, date of birth, ID band ?Patient awake ? ? ? ?Reviewed: ?Allergy & Precautions, NPO status , Patient's Chart, lab work & pertinent test results ? ?Airway ?Mallampati: I ? ?TM Distance: >3 FB ?Neck ROM: Full ? ? ? Dental ?no notable dental hx. ?(+) Teeth Intact, Dental Advisory Given ?  ?Pulmonary ?neg pulmonary ROS,  ?  ?Pulmonary exam normal ?breath sounds clear to auscultation ? ? ? ? ? ? Cardiovascular ?Exercise Tolerance: Good ?Normal cardiovascular exam ?Rhythm:Regular Rate:Normal ? ? ?  ?Neuro/Psych ?negative neurological ROS ?   ? GI/Hepatic ?Neg liver ROS,   ?Endo/Other  ?negative endocrine ROS ? Renal/GU ?Lab Results ?     Component                Value               Date                 ?     CREATININE               0.55                11/28/2021           ?     BUN                      9                   11/28/2021           ?     NA                       134 (L)             11/28/2021           ?     K                        3.6                 11/28/2021           ?     CL                       105                 11/28/2021           ?     CO2                      22                  11/28/2021           ?  ? ?  ?Musculoskeletal ? ?(+) Fibromyalgia - ? Abdominal ?  ?Peds ? Hematology ?Lab Results ?     Component                Value               Date                 ?     WBC                      9.7  11/28/2021           ?     HGB                      12.5                11/28/2021           ?     HCT                      35.9 (L)            11/28/2021           ?     MCV                      90.7                11/28/2021           ?     PLT                      249                 11/28/2021           ?   ?Anesthesia Other Findings ?All: PCN ? Reproductive/Obstetrics ? ?  ? ? ? ? ? ? ? ? ? ? ? ? ? ?  ?  ? ? ? ? ? ? ? ?Anesthesia Physical ?Anesthesia Plan ? ?ASA: 2 ? ?Anesthesia Plan: General   ? ?Post-op Pain Management: Toradol IV (intra-op)*, Tylenol PO (pre-op)* and Precedex  ? ?Induction: Intravenous ? ?PONV Risk Score and Plan: Treatment may vary due to age or medical condition, Midazolam, Ondansetron and Dexamethasone ? ?Airway Management Planned: LMA ? ?Additional Equipment: None ? ?Intra-op Plan:  ? ?Post-operative Plan:  ? ?Informed Consent: I have reviewed the patients History and Physical, chart, labs and discussed the procedure including the risks, benefits and alternatives for the proposed anesthesia with the patient or authorized representative who has indicated his/her understanding and acceptance.  ? ? ? ?Dental advisory given ? ?Plan Discussed with:  ? ?Anesthesia Plan Comments:   ? ? ? ? ? ?Anesthesia Quick Evaluation ? ?

## 2021-12-06 ENCOUNTER — Ambulatory Visit (HOSPITAL_BASED_OUTPATIENT_CLINIC_OR_DEPARTMENT_OTHER): Payer: Self-pay | Admitting: Anesthesiology

## 2021-12-06 ENCOUNTER — Encounter (HOSPITAL_BASED_OUTPATIENT_CLINIC_OR_DEPARTMENT_OTHER)
Admission: RE | Disposition: A | Discharge status: Home / Self Care | Payer: Self-pay | Source: Home / Self Care | Attending: Obstetrics & Gynecology

## 2021-12-06 ENCOUNTER — Encounter (HOSPITAL_BASED_OUTPATIENT_CLINIC_OR_DEPARTMENT_OTHER): Payer: Self-pay | Admitting: Obstetrics & Gynecology

## 2021-12-06 ENCOUNTER — Other Ambulatory Visit (HOSPITAL_BASED_OUTPATIENT_CLINIC_OR_DEPARTMENT_OTHER): Payer: Self-pay | Admitting: Obstetrics & Gynecology

## 2021-12-06 ENCOUNTER — Encounter (HOSPITAL_BASED_OUTPATIENT_CLINIC_OR_DEPARTMENT_OTHER)
Admission: RE | Admit: 2021-12-06 | Discharge: 2021-12-06 | Disposition: A | Discharge status: Home / Self Care | Payer: Self-pay | Source: Ambulatory Visit | Attending: Obstetrics & Gynecology | Admitting: Obstetrics & Gynecology

## 2021-12-06 ENCOUNTER — Other Ambulatory Visit: Payer: Self-pay

## 2021-12-06 ENCOUNTER — Ambulatory Visit (HOSPITAL_BASED_OUTPATIENT_CLINIC_OR_DEPARTMENT_OTHER)
Admission: RE | Admit: 2021-12-06 | Discharge: 2021-12-06 | Disposition: A | Discharge status: Home / Self Care | Payer: Self-pay | Attending: Obstetrics & Gynecology | Admitting: Obstetrics & Gynecology

## 2021-12-06 DIAGNOSIS — M797 Fibromyalgia: Secondary | ICD-10-CM | POA: Insufficient documentation

## 2021-12-06 DIAGNOSIS — Z01818 Encounter for other preprocedural examination: Secondary | ICD-10-CM

## 2021-12-06 DIAGNOSIS — O021 Missed abortion: Secondary | ICD-10-CM

## 2021-12-06 HISTORY — PX: DILATION AND EVACUATION: SHX1459

## 2021-12-06 LAB — CBC
HCT: 36.7 % (ref 36.0–46.0)
Hemoglobin: 13.3 g/dL (ref 12.0–15.0)
MCH: 32.6 pg (ref 26.0–34.0)
MCHC: 36.2 g/dL — ABNORMAL HIGH (ref 30.0–36.0)
MCV: 90 fL (ref 80.0–100.0)
Platelets: 258 10*3/uL (ref 150–400)
RBC: 4.08 MIL/uL (ref 3.87–5.11)
RDW: 12.5 % (ref 11.5–15.5)
WBC: 7.6 10*3/uL (ref 4.0–10.5)
nRBC: 0 % (ref 0.0–0.2)

## 2021-12-06 LAB — TYPE AND SCREEN
ABO/RH(D): O POS
Antibody Screen: NEGATIVE

## 2021-12-06 SURGERY — DILATION AND EVACUATION, UTERUS
Anesthesia: General | Site: Uterus

## 2021-12-06 MED ORDER — LIDOCAINE 2% (20 MG/ML) 5 ML SYRINGE
INTRAMUSCULAR | Status: AC
  Filled 2021-12-06: qty 5

## 2021-12-06 MED ORDER — OXYCODONE-ACETAMINOPHEN 5-325 MG PO TABS: 1.0000 | ORAL_TABLET | Freq: Four times a day (QID) | ORAL | 0 refills | Status: DC | PRN

## 2021-12-06 MED ORDER — EPHEDRINE 5 MG/ML INJ
INTRAVENOUS | Status: AC
  Filled 2021-12-06: qty 5

## 2021-12-06 MED ORDER — KETOROLAC TROMETHAMINE 30 MG/ML IJ SOLN: 30.0000 mg | Freq: Once | INTRAMUSCULAR | Status: DC | PRN

## 2021-12-06 MED ORDER — SCOPOLAMINE 1 MG/3DAYS TD PT72
1.0000 | MEDICATED_PATCH | TRANSDERMAL | Status: DC
  Administered 2021-12-06: 1.5 mg via TRANSDERMAL

## 2021-12-06 MED ORDER — ONDANSETRON HCL 4 MG/2ML IJ SOLN
INTRAMUSCULAR | Status: AC
  Filled 2021-12-06: qty 2

## 2021-12-06 MED ORDER — OXYCODONE HCL 5 MG/5ML PO SOLN: 5.0000 mg | Freq: Once | ORAL | Status: AC | PRN

## 2021-12-06 MED ORDER — PHENYLEPHRINE 80 MCG/ML (10ML) SYRINGE FOR IV PUSH (FOR BLOOD PRESSURE SUPPORT)
PREFILLED_SYRINGE | INTRAVENOUS | Status: AC
  Filled 2021-12-06: qty 10

## 2021-12-06 MED ORDER — LIDOCAINE HCL (CARDIAC) PF 100 MG/5ML IV SOSY
PREFILLED_SYRINGE | INTRAVENOUS | Status: DC | PRN
  Administered 2021-12-06: 70 mg via INTRAVENOUS

## 2021-12-06 MED ORDER — SODIUM CHLORIDE 0.9 % IV SOLN
INTRAVENOUS | Status: AC
  Filled 2021-12-06: qty 100

## 2021-12-06 MED ORDER — SODIUM CHLORIDE 0.9 % IV SOLN
100.0000 mg | Freq: Once | INTRAVENOUS | Status: AC
  Administered 2021-12-06: 100 mg via INTRAVENOUS

## 2021-12-06 MED ORDER — LACTATED RINGERS IV SOLN: INTRAVENOUS | Status: DC

## 2021-12-06 MED ORDER — DEXAMETHASONE SODIUM PHOSPHATE 10 MG/ML IJ SOLN
INTRAMUSCULAR | Status: AC
  Filled 2021-12-06: qty 1

## 2021-12-06 MED ORDER — FENTANYL CITRATE (PF) 100 MCG/2ML IJ SOLN
INTRAMUSCULAR | Status: AC
  Filled 2021-12-06: qty 2

## 2021-12-06 MED ORDER — DEXAMETHASONE SODIUM PHOSPHATE 4 MG/ML IJ SOLN
INTRAMUSCULAR | Status: DC | PRN
Start: 2021-12-06 — End: 2021-12-06
  Administered 2021-12-06: 10 mg via INTRAVENOUS

## 2021-12-06 MED ORDER — SILVER NITRATE-POT NITRATE 75-25 % EX MISC
CUTANEOUS | Status: AC
  Filled 2021-12-06: qty 30

## 2021-12-06 MED ORDER — OXYCODONE HCL 5 MG PO TABS
5.0000 mg | ORAL_TABLET | Freq: Once | ORAL | Status: AC | PRN
  Administered 2021-12-06: 5 mg via ORAL

## 2021-12-06 MED ORDER — MIDAZOLAM HCL 2 MG/2ML IJ SOLN
INTRAMUSCULAR | Status: AC
  Filled 2021-12-06: qty 2

## 2021-12-06 MED ORDER — HYDROMORPHONE HCL 1 MG/ML IJ SOLN: 0.2500 mg | INTRAMUSCULAR | Status: DC | PRN

## 2021-12-06 MED ORDER — IBUPROFEN 800 MG PO TABS: 800.0000 mg | ORAL_TABLET | Freq: Three times a day (TID) | ORAL | 0 refills | Status: DC | PRN

## 2021-12-06 MED ORDER — ONDANSETRON HCL 4 MG/2ML IJ SOLN: 4.0000 mg | Freq: Once | INTRAMUSCULAR | Status: DC | PRN

## 2021-12-06 MED ORDER — ATROPINE SULFATE 0.4 MG/ML IV SOLN
INTRAVENOUS | Status: AC
  Filled 2021-12-06: qty 1

## 2021-12-06 MED ORDER — PROPOFOL 10 MG/ML IV BOLUS
INTRAVENOUS | Status: DC | PRN
  Administered 2021-12-06: 170 mg via INTRAVENOUS

## 2021-12-06 MED ORDER — ONDANSETRON HCL 4 MG/2ML IJ SOLN
INTRAMUSCULAR | Status: DC | PRN
  Administered 2021-12-06: 4 mg via INTRAVENOUS

## 2021-12-06 MED ORDER — DEXMEDETOMIDINE (PRECEDEX) IN NS 20 MCG/5ML (4 MCG/ML) IV SYRINGE
PREFILLED_SYRINGE | INTRAVENOUS | Status: DC | PRN
  Administered 2021-12-06: 12 ug via INTRAVENOUS

## 2021-12-06 MED ORDER — ACETAMINOPHEN 500 MG PO TABS
ORAL_TABLET | ORAL | Status: AC
  Filled 2021-12-06: qty 2

## 2021-12-06 MED ORDER — ACETAMINOPHEN 500 MG PO TABS
1000.0000 mg | ORAL_TABLET | Freq: Once | ORAL | Status: AC
  Administered 2021-12-06: 1000 mg via ORAL

## 2021-12-06 MED ORDER — POVIDONE-IODINE 10 % EX SWAB
2.0000 "application " | Freq: Once | CUTANEOUS | Status: AC
  Administered 2021-12-06: 2 via TOPICAL

## 2021-12-06 MED ORDER — SUCCINYLCHOLINE CHLORIDE 200 MG/10ML IV SOSY
PREFILLED_SYRINGE | INTRAVENOUS | Status: AC
  Filled 2021-12-06: qty 10

## 2021-12-06 MED ORDER — OXYCODONE HCL 5 MG PO TABS
ORAL_TABLET | ORAL | Status: AC
  Filled 2021-12-06: qty 1

## 2021-12-06 MED ORDER — FENTANYL CITRATE (PF) 100 MCG/2ML IJ SOLN
INTRAMUSCULAR | Status: DC | PRN
  Administered 2021-12-06: 50 ug via INTRAVENOUS

## 2021-12-06 MED ORDER — MIDAZOLAM HCL 5 MG/5ML IJ SOLN
INTRAMUSCULAR | Status: DC | PRN
  Administered 2021-12-06: 2 mg via INTRAVENOUS

## 2021-12-06 MED ORDER — SCOPOLAMINE 1 MG/3DAYS TD PT72
MEDICATED_PATCH | TRANSDERMAL | Status: AC
  Filled 2021-12-06: qty 1

## 2021-12-06 SURGICAL SUPPLY — 24 items
CATH ROBINSON RED A/P 16FR (CATHETERS) ×2 IMPLANT
DILATOR CANAL MILEX (MISCELLANEOUS) IMPLANT
GAUZE 4X4 16PLY ~~LOC~~+RFID DBL (SPONGE) ×2 IMPLANT
GLOVE BIOGEL M 6.5 STRL (GLOVE) ×4 IMPLANT
GLOVE BIOGEL PI IND STRL 6.5 (GLOVE) ×1 IMPLANT
GLOVE BIOGEL PI IND STRL 7.0 (GLOVE) ×1 IMPLANT
GLOVE BIOGEL PI INDICATOR 6.5 (GLOVE) ×1
GLOVE BIOGEL PI INDICATOR 7.0 (GLOVE) ×1
GOWN STRL REUS W/ TWL LRG LVL3 (GOWN DISPOSABLE) ×2 IMPLANT
GOWN STRL REUS W/TWL LRG LVL3 (GOWN DISPOSABLE) ×4
HIBICLENS CHG 4% 4OZ BTL (MISCELLANEOUS) ×2 IMPLANT
KIT BERKELEY 1ST TRI 3/8 NO TR (MISCELLANEOUS) ×2 IMPLANT
KIT BERKELEY 1ST TRIMESTER 3/8 (MISCELLANEOUS) ×2 IMPLANT
NS IRRIG 1000ML POUR BTL (IV SOLUTION) ×2 IMPLANT
PACK VAGINAL MINOR WOMEN LF (CUSTOM PROCEDURE TRAY) ×2 IMPLANT
PAD OB MATERNITY 4.3X12.25 (PERSONAL CARE ITEMS) ×2 IMPLANT
PAD PREP 24X48 CUFFED NSTRL (MISCELLANEOUS) ×2 IMPLANT
SET BERKELEY SUCTION TUBING (SUCTIONS) ×2 IMPLANT
SPIKE FLUID TRANSFER (MISCELLANEOUS) ×2 IMPLANT
TOWEL GREEN STERILE FF (TOWEL DISPOSABLE) ×4 IMPLANT
VACURETTE 6 ASPIR F TIP BERK (CANNULA) IMPLANT
VACURETTE 7MM CVD STRL WRAP (CANNULA) ×1 IMPLANT
VACURETTE 8 RIGID CVD (CANNULA) IMPLANT
VACURETTE 9 RIGID CVD (CANNULA) IMPLANT

## 2021-12-06 NOTE — Anesthesia Procedure Notes (Signed)
Procedure Name: LMA Insertion ?Date/Time: 12/06/2021 3:29 PM ?Performed by: Thornell Mule, CRNA ?Pre-anesthesia Checklist: Patient identified, Emergency Drugs available, Suction available and Patient being monitored ?Patient Re-evaluated:Patient Re-evaluated prior to induction ?Oxygen Delivery Method: Circle system utilized ?Preoxygenation: Pre-oxygenation with 100% oxygen ?Induction Type: IV induction ?LMA: LMA inserted ?LMA Size: 4.0 ?Number of attempts: 1 ?Placement Confirmation: positive ETCO2 ?Tube secured with: Tape ?Dental Injury: Teeth and Oropharynx as per pre-operative assessment  ? ? ? ? ?

## 2021-12-06 NOTE — H&P (Signed)
Andrea Hogan is an 26 y.o. female. G1 here for suction D&C due to missed abortion.  She has not had any bleeding.  Pt was seen in ER on 11/28/2021 due to pelvic and back pain.  Pregnancy was noted and ultrasound showed [redacted] week gestation without any heartbeat.  Failed pregnancy noted.  Pt has follow up the next day with Dr. Crissie Reese where options were discussed.  Pt has decided to proceed with definitive procedure.  Risks and benefits reviewed with pt today.  Questions answered.  She does want genetic testing done today as well. ?  ? ?History reviewed. No pertinent past medical history. ? ?Past Surgical History:  ?Procedure Laterality Date  ? NO PAST SURGERIES    ? ? ?History reviewed. No pertinent family history. ? ?Social History:  reports that she has never smoked. She has never used smokeless tobacco. She reports that she does not currently use alcohol. She reports that she does not use drugs. ? ?Allergies:  ?Allergies  ?Allergen Reactions  ? Penicillins   ? ? ?Medications Prior to Admission  ?Medication Sig Dispense Refill Last Dose  ? ondansetron (ZOFRAN-ODT) 4 MG disintegrating tablet Take 1 tablet (4 mg total) by mouth every 8 (eight) hours as needed for nausea or vomiting. 20 tablet 0 12/05/2021  ? oxyCODONE (ROXICODONE) 5 MG immediate release tablet Take 1 tablet (5 mg total) by mouth every 4 (four) hours as needed for severe pain. 6 tablet 0 Past Week  ? ? ?Review of Systems  ?All other systems reviewed and are negative. ? ?Blood pressure 109/64, pulse 75, temperature (!) 97.5 ?F (36.4 ?C), temperature source Oral, resp. rate 16, height 5\' 4"  (1.626 m), weight 57.9 kg, last menstrual period 10/02/2021, SpO2 100 %. ?Physical Exam ?Constitutional:   ?   Appearance: Normal appearance.  ?Cardiovascular:  ?   Rate and Rhythm: Normal rate and regular rhythm.  ?Pulmonary:  ?   Effort: Pulmonary effort is normal.  ?   Breath sounds: Normal breath sounds.  ?Neurological:  ?   General: No focal deficit present.  ?    Mental Status: She is alert.  ?Psychiatric:     ?   Mood and Affect: Mood normal.  ? ? ?Results for orders placed or performed during the hospital encounter of 12/06/21 (from the past 24 hour(s))  ?Type and screen     Status: None  ? Collection Time: 12/06/21  9:49 AM  ?Result Value Ref Range  ? ABO/RH(D) O POS   ? Antibody Screen NEG   ? Sample Expiration    ?  12/09/2021,2359 ?Performed at [facility], [address], [place], [place] ?  ?CBC     Status: Abnormal  ? Collection Time: 12/06/21 10:13 AM  ?Result Value Ref Range  ? WBC 7.6 4.0 - 10.5 K/uL  ? RBC 4.08 3.87 - 5.11 MIL/uL  ? Hemoglobin 13.3 12.0 - 15.0 g/dL  ? HCT 36.7 36.0 - 46.0 %  ? MCV 90.0 80.0 - 100.0 fL  ? MCH 32.6 26.0 - 34.0 pg  ? MCHC 36.2 (H) 30.0 - 36.0 g/dL  ? RDW 12.5 11.5 - 15.5 %  ? Platelets 258 150 - 400 K/uL  ? nRBC 0.0 0.0 - 0.2 %  ? ? ?No results found. ? ?Assessment/Plan: ?26 yo G1 with missed ab here for suction D&C and chromosomal testing on fetal tissue.  Questions answered.  Pt ready to proceed. ? ?30 ?12/06/2021, 3:02 PM ? ?

## 2021-12-06 NOTE — Anesthesia Postprocedure Evaluation (Signed)
Anesthesia Post Note ? ?Patient: Andrea Hogan ? ?Procedure(s) Performed: DILATATION AND EVACUATION (Uterus) ?CHROMOSOME STUDIES (Uterus) ? ?  ? ?Patient location during evaluation: PACU ?Anesthesia Type: General ?Level of consciousness: awake and alert ?Pain management: pain level controlled ?Vital Signs Assessment: post-procedure vital signs reviewed and stable ?Respiratory status: spontaneous breathing, nonlabored ventilation, respiratory function stable and patient connected to nasal cannula oxygen ?Cardiovascular status: blood pressure returned to baseline and stable ?Postop Assessment: no apparent nausea or vomiting ?Anesthetic complications: no ? ? ?No notable events documented. ? ?Last Vitals:  ?Vitals:  ? 12/06/21 1622 12/06/21 1643  ?BP: 104/69 130/79  ?Pulse: 80 (!) 106  ?Resp: 17 18  ?Temp:  36.9 ?C  ?SpO2: 98% 95%  ?  ?Last Pain:  ?Vitals:  ? 12/06/21 1643  ?TempSrc: Oral  ?PainSc: 3   ? ? ?  ?  ?  ?  ?  ?  ? ?Trevor Iha ? ? ? ? ?

## 2021-12-06 NOTE — Op Note (Signed)
12/06/2021 ? ?4:13 PM ? ?PATIENT:  Andrea Hogan  26 y.o. female ? ?PRE-OPERATIVE DIAGNOSIS:  MAB ? ?POST-OPERATIVE DIAGNOSIS:  MAB ? ?PROCEDURE:  Procedure(s): ?DILATATION AND EVACUATION ?CHROMOSOME STUDIES ? ?SURGEON:  Jerene Bears ? ?ASSISTANTS: OR staff.   ? ?ANESTHESIA:   general ? ?ESTIMATED BLOOD LOSS: 25 mL ? ?BLOOD ADMINISTERED:none  ? ?FLUIDS: 600cc LR ? ?UOP: pt voided before going back to the operating room ? ?SPECIMEN:  POCs and tissue sent for [facility] testing ? ?DISPOSITION OF SPECIMEN:  PATHOLOGY ? ?FINDINGS: 8-10 week sized retroflexed uterus ? ?DESCRIPTION OF OPERATION: Patient was taken to the operating room.  She is placed in the supine position. SCDs were on her lower extremities and functioning properly. General anesthesia with an LMA was administered without difficulty. Dr. Collins Scotland, anesthesia, oversaw case. ? ?Legs were then placed in the [facility] stirrups in the low lithotomy position. The legs were lifted to the high lithotomy position and the Betadine prep was used on the inner thighs perineum and vagina x3. Patient was draped in a normal standard fashion. Pt voided before going back to the OR so no I&O cath was performed.  A bivalve speculum was placed the vagina. The anterior lip of the cervix was grasped with single-tooth tenaculum.  The endometrial cavity sounded to 12 cm. ? ?The cervix was dilated to a #23 Pratt dilator.  Using a #7 curved suction tip, this was passed to the fundus.  Suction was applied and the tip rotated clockwise.  This was done twice with what appeared to be POCs present.  The suction tip was removed.  A #1 smooth curette was used to ensure there was a rough gritty texture noted throughout the endometrial cavity.  Then one final pass with the suction device was performed.  Minimal bleeding was noted and no additional tissue was obtained.  At this point, the procedure was ended.  The tenaculum was removed.  All instruments were removed from the vagina.  Minimal bleeding  was noted.  ? ?The prep was cleansed of the patient's skin. The legs are positioned back in the supine position. Sponge, lap, needle, initially counts were correct x2. Patient was taken to recovery in stable condition. ? ?COUNTS:  YES ? ?PLAN OF CARE: Transfer to PACU ? ? ? ? ? ? ? ? ? ? ? ? ?  ? ? ?

## 2021-12-06 NOTE — Transfer of Care (Signed)
Immediate Anesthesia Transfer of Care Note ? ?Patient: Jadalyn Oliveri ? ?Procedure(s) Performed: DILATATION AND EVACUATION (Uterus) ?CHROMOSOME STUDIES (Uterus) ? ?Patient Location: PACU ? ?Anesthesia Type:General ? ?Level of Consciousness: drowsy and patient cooperative ? ?Airway & Oxygen Therapy: Patient Spontanous Breathing and Patient connected to face mask oxygen ? ?Post-op Assessment: Report given to RN and Post -op Vital signs reviewed and stable ? ?Post vital signs: Reviewed and stable ? ?Last Vitals:  ?Vitals Value Taken Time  ?BP 103/73 12/06/21 1602  ?Temp    ?Pulse 61 12/06/21 1603  ?Resp 11 12/06/21 1603  ?SpO2 100 % 12/06/21 1603  ?Vitals shown include unvalidated device data. ? ?Last Pain:  ?Vitals:  ? 12/06/21 1230  ?TempSrc: Oral  ?PainSc: 5   ?   ? ?Patients Stated Pain Goal: 5 (12/06/21 1230) ? ?Complications: No notable events documented. ?

## 2021-12-06 NOTE — Discharge Instructions (Addendum)
May take Tylenol after 6:35pm, if needed.  ? ? ?Post Anesthesia Home Care Instructions ? ?Activity: ?Get plenty of rest for the remainder of the day. A responsible individual must stay with you for 24 hours following the procedure.  ?For the next 24 hours, DO NOT: ?-Drive a car ?-Advertising copywriter ?-Drink alcoholic beverages ?-Take any medication unless instructed by your physician ?-Make any legal decisions or sign important papers. ? ?Meals: ?Start with liquid foods such as gelatin or soup. Progress to regular foods as tolerated. Avoid greasy, spicy, heavy foods. If nausea and/or vomiting occur, drink only clear liquids until the nausea and/or vomiting subsides. Call your physician if vomiting continues. ? ?Special Instructions/Symptoms: ?Your throat may feel dry or sore from the anesthesia or the breathing tube placed in your throat during surgery. If this causes discomfort, gargle with warm salt water. The discomfort should disappear within 24 hours. ? ?If you had a scopolamine patch placed behind your ear for the management of post- operative nausea and/or vomiting: ? ?1. The medication in the patch is effective for 72 hours, after which it should be removed.  Wrap patch in a tissue and discard in the trash. Wash hands thoroughly with soap and water. ?2. You may remove the patch earlier than 72 hours if you experience unpleasant side effects which may include dry mouth, dizziness or visual disturbances. ?3. Avoid touching the patch. Wash your hands with soap and water after contact with the patch. ? ?Post-surgical Instructions, Outpatient Surgery ? ?You may expect to feel dizzy, weak, and drowsy for as long as 24 hours after receiving the medicine that made you sleep (anesthetic). For the first 24 hours after your surgery:   ?Do not drive a car, ride a bicycle, participate in physical activities, or take public transportation until you are done taking narcotic pain medicines or as directed by Dr. Hyacinth Meeker.  ?Do  not drink alcohol or take tranquilizers.  ?Do not take medicine that has not been prescribed by your physicians.  ?Do not sign important papers or make important decisions while on narcotic pain medicines.  ?Have a responsible person with you.  ? ?PAIN MANAGEMENT ?Motrin 800mg .  (This is the same as 4-200mg  over the counter tablets of Motrin or ibuprofen.)  You may take this every eight hours or as needed for cramping.   ?Vicodin 5/325mg .  For more severe pain, take one or two tablets every four to six hours as needed for pain control.  (Remember that narcotic pain medications increase your risk of constipation.  If this becomes a problem, you may take an over the counter stool softener like Colace 100mg  up to four times a day.) ? ?DO'S AND DON'T'S ?Do not take a tub bath for two weeks.  You may shower on the first day after your surgery ?Do not do any heavy lifting for one to two weeks.  This increases the chance of bleeding. ?Do move around as you feel able.  Stairs are fine.  You may begin to exercise again as you feel able.  Do not lift any weights for two weeks. ?Do not put anything in the vagina for two weeks--no tampons, intercourse, or douching.   ? ?REGULAR MEDIATIONS/VITAMINS: ?You may restart all of your regular medications as prescribed. ?You may restart all of your vitamins as you normally take them.   ? ?PLEASE CALL OR SEEK MEDICAL CARE IF: ?You have persistent nausea and vomiting.  ?You have trouble eating or drinking.  ?You have an  oral temperature above 100.5.  ?You have constipation that is not helped by adjusting diet or increasing fluid intake. Pain medicines are a common cause of constipation.  ?You have heavy vaginal bleeding ?You have redness or drainage from your incision(s) or there is increasing pain or tenderness near or in the surgical site.  ? ? ?   Oxycodone given at 4:50pm today ?

## 2021-12-07 ENCOUNTER — Encounter (HOSPITAL_BASED_OUTPATIENT_CLINIC_OR_DEPARTMENT_OTHER): Payer: Self-pay | Admitting: *Deleted

## 2021-12-07 ENCOUNTER — Encounter (HOSPITAL_BASED_OUTPATIENT_CLINIC_OR_DEPARTMENT_OTHER): Payer: Self-pay | Admitting: Obstetrics & Gynecology

## 2021-12-07 NOTE — Progress Notes (Signed)
Left message stating courtesy call and if any questions or concerns please call the doctors office.  

## 2021-12-08 LAB — SURGICAL PATHOLOGY

## 2021-12-11 ENCOUNTER — Other Ambulatory Visit: Payer: Self-pay

## 2021-12-11 ENCOUNTER — Encounter (HOSPITAL_BASED_OUTPATIENT_CLINIC_OR_DEPARTMENT_OTHER): Payer: Self-pay | Admitting: Emergency Medicine

## 2021-12-11 ENCOUNTER — Other Ambulatory Visit: Payer: Self-pay | Admitting: Obstetrics and Gynecology

## 2021-12-11 ENCOUNTER — Ambulatory Visit (HOSPITAL_BASED_OUTPATIENT_CLINIC_OR_DEPARTMENT_OTHER)
Admission: EM | Admit: 2021-12-11 | Discharge: 2021-12-12 | Disposition: A | Discharge status: Home / Self Care | Payer: Self-pay | Attending: Emergency Medicine | Admitting: Emergency Medicine

## 2021-12-11 ENCOUNTER — Emergency Department (HOSPITAL_BASED_OUTPATIENT_CLINIC_OR_DEPARTMENT_OTHER): Payer: Self-pay

## 2021-12-11 DIAGNOSIS — O034 Incomplete spontaneous abortion without complication: Secondary | ICD-10-CM | POA: Insufficient documentation

## 2021-12-11 DIAGNOSIS — N939 Abnormal uterine and vaginal bleeding, unspecified: Secondary | ICD-10-CM

## 2021-12-11 DIAGNOSIS — R1030 Lower abdominal pain, unspecified: Secondary | ICD-10-CM

## 2021-12-11 DIAGNOSIS — Z9889 Other specified postprocedural states: Secondary | ICD-10-CM

## 2021-12-11 LAB — COMPREHENSIVE METABOLIC PANEL
ALT: 63 U/L — ABNORMAL HIGH (ref 0–44)
AST: 44 U/L — ABNORMAL HIGH (ref 15–41)
Albumin: 3.8 g/dL (ref 3.5–5.0)
Alkaline Phosphatase: 71 U/L (ref 38–126)
Anion gap: 7 (ref 5–15)
BUN: 8 mg/dL (ref 6–20)
CO2: 22 mmol/L (ref 22–32)
Calcium: 9.4 mg/dL (ref 8.9–10.3)
Chloride: 107 mmol/L (ref 98–111)
Creatinine, Ser: 0.47 mg/dL (ref 0.44–1.00)
GFR, Estimated: >60 mL/min (ref >60)
Glucose, Bld: 104 mg/dL — ABNORMAL HIGH (ref 70–99)
Potassium: 4 mmol/L (ref 3.5–5.1)
Sodium: 136 mmol/L (ref 135–145)
Total Bilirubin: 0.2 mg/dL — ABNORMAL LOW (ref 0.3–1.2)
Total Protein: 7.5 g/dL (ref 6.5–8.1)

## 2021-12-11 LAB — URINALYSIS, ROUTINE W REFLEX MICROSCOPIC
Bilirubin Urine: NEGATIVE
Glucose, UA: NEGATIVE mg/dL
Ketones, ur: NEGATIVE mg/dL
Nitrite: NEGATIVE
Protein, ur: NEGATIVE mg/dL
Specific Gravity, Urine: 1.02 (ref 1.005–1.030)
pH: 7 (ref 5.0–8.0)

## 2021-12-11 LAB — URINALYSIS, MICROSCOPIC (REFLEX)

## 2021-12-11 LAB — CBC WITH DIFFERENTIAL/PLATELET
Abs Immature Granulocytes: 0.03 10*3/uL (ref 0.00–0.07)
Basophils Absolute: 0 10*3/uL (ref 0.0–0.1)
Basophils Relative: 0 %
Eosinophils Absolute: 0.1 10*3/uL (ref 0.0–0.5)
Eosinophils Relative: 1 %
HCT: 35.8 % — ABNORMAL LOW (ref 36.0–46.0)
Hemoglobin: 12.4 g/dL (ref 12.0–15.0)
Immature Granulocytes: 0 %
Lymphocytes Relative: 21 %
Lymphs Abs: 2 10*3/uL (ref 0.7–4.0)
MCH: 31.4 pg (ref 26.0–34.0)
MCHC: 34.6 g/dL (ref 30.0–36.0)
MCV: 90.6 fL (ref 80.0–100.0)
Monocytes Absolute: 0.7 10*3/uL (ref 0.1–1.0)
Monocytes Relative: 8 %
Neutro Abs: 6.4 10*3/uL (ref 1.7–7.7)
Neutrophils Relative %: 70 %
Platelets: 280 10*3/uL (ref 150–400)
RBC: 3.95 MIL/uL (ref 3.87–5.11)
RDW: 12.6 % (ref 11.5–15.5)
WBC: 9.3 10*3/uL (ref 4.0–10.5)
nRBC: 0 % (ref 0.0–0.2)

## 2021-12-11 LAB — HCG, QUANTITATIVE, PREGNANCY: hCG, Beta Chain, Quant, S: 6987 m[IU]/mL — ABNORMAL HIGH (ref <5)

## 2021-12-11 MED ORDER — METRONIDAZOLE 500 MG PO TABS
500.0000 mg | ORAL_TABLET | Freq: Once | ORAL | Status: AC
  Administered 2021-12-11: 500 mg via ORAL
  Filled 2021-12-11: qty 1

## 2021-12-11 MED ORDER — METRONIDAZOLE 500 MG/100ML IV SOLN
500.0000 mg | Freq: Two times a day (BID) | INTRAVENOUS | Status: DC
  Administered 2021-12-11: 500 mg via INTRAVENOUS
  Filled 2021-12-11: qty 100

## 2021-12-11 MED ORDER — ONDANSETRON HCL 4 MG/2ML IJ SOLN
4.0000 mg | Freq: Once | INTRAMUSCULAR | Status: AC
  Administered 2021-12-11: 4 mg via INTRAVENOUS
  Filled 2021-12-11: qty 2

## 2021-12-11 MED ORDER — DOXYCYCLINE HYCLATE 100 MG PO CAPS: 100.0000 mg | ORAL_CAPSULE | Freq: Two times a day (BID) | ORAL | 0 refills | Status: DC

## 2021-12-11 MED ORDER — OXYCODONE HCL 5 MG PO TABS: 5.0000 mg | ORAL_TABLET | ORAL | 0 refills | Status: DC | PRN

## 2021-12-11 MED ORDER — METRONIDAZOLE 500 MG PO TABS: 500.0000 mg | ORAL_TABLET | Freq: Two times a day (BID) | ORAL | 0 refills | Status: DC

## 2021-12-11 MED ORDER — KETOROLAC TROMETHAMINE 60 MG/2ML IM SOLN
60.0000 mg | Freq: Once | INTRAMUSCULAR | Status: AC
  Administered 2021-12-11: 60 mg via INTRAMUSCULAR
  Filled 2021-12-11: qty 2

## 2021-12-11 MED ORDER — DOXYCYCLINE HYCLATE 100 MG PO TABS
100.0000 mg | ORAL_TABLET | Freq: Once | ORAL | Status: AC
  Administered 2021-12-11: 100 mg via ORAL
  Filled 2021-12-11: qty 1

## 2021-12-11 MED ORDER — SODIUM CHLORIDE 0.9 % IV SOLN
100.0000 mg | Freq: Once | INTRAVENOUS | Status: AC
  Administered 2021-12-11: 100 mg via INTRAVENOUS
  Filled 2021-12-11: qty 100

## 2021-12-11 MED ORDER — MORPHINE SULFATE (PF) 4 MG/ML IV SOLN
4.0000 mg | Freq: Once | INTRAVENOUS | Status: AC
  Administered 2021-12-11: 4 mg via INTRAVENOUS
  Filled 2021-12-11: qty 1

## 2021-12-11 MED ORDER — ONDANSETRON 4 MG PO TBDP: 4.0000 mg | ORAL_TABLET | Freq: Three times a day (TID) | ORAL | 0 refills | Status: DC | PRN

## 2021-12-11 MED ORDER — LACTATED RINGERS IV BOLUS
1000.0000 mL | Freq: Once | INTRAVENOUS | Status: AC
  Administered 2021-12-11: 1000 mL via INTRAVENOUS

## 2021-12-11 NOTE — ED Notes (Signed)
Patient transported to Ultrasound 

## 2021-12-11 NOTE — ED Notes (Signed)
Pt had 3 vomiting episodes post zofran administration. Pt unable to tolerate ordered oral antibiotics. MD made aware.

## 2021-12-11 NOTE — Discharge Instructions (Addendum)
   We will discuss your surgery once again in detail at your post-op visit in two to four weeks. If you haven't already done so, please call to make your appointment as soon as possible.  Dilation and Curettage or Vacuum Curettage, Care After These instructions give you information on caring for yourself after your procedure. Your doctor may also give you more specific instructions. Call your doctor if you have any problems or questions after your procedure. HOME CARE Do not drive for 24 hours. Wait 1 week before doing any activities that wear you out. Do not stand for a long time. Limit stair climbing to once or twice a day. Rest often. Continue with your usual diet. Drink enough fluids to keep your pee (urine) clear or pale yellow. If you have a hard time pooping (constipation), you may: Take a medicine to help you go poop (laxative) as told by your doctor. Eat more fruit and bran. Drink more fluids. Take showers, not baths, for as long as told by your doctor. Do not swim or use a hot tub until your doctor says it is okay. Have someone with you for 1day after the procedure. Do not douche, use tampons, or have sex (intercourse) until seen by your doctor Only take medicines as told by your doctor. Do not take aspirin. It can cause bleeding. Keep all doctor visits. GET HELP IF: You have cramps or pain not helped by medicine. You have new pain in the belly (abdomen). You have a bad smelling fluid coming from your vagina. You have a rash. You have problems with any medicine. GET HELP RIGHT AWAY IF:  You start to bleed more than a regular period. You have a fever. You have chest pain. You have trouble breathing. You feel dizzy or feel like passing out (fainting). You pass out. You have pain in the tops of your shoulders. You have vaginal bleeding with or without clumps of blood (blood clots). MAKE SURE YOU: Understand these instructions. Will watch your condition. Will get help  right away if you are not doing well or get worse. Document Released: 04/18/2008 Document Revised: 07/15/2013 Document Reviewed: 02/06/2013 Exit[employer] Patient Information 2015 [employer]. This information is not intended to replace advice given to you by your health care provider. Make sure you discuss any questions you have with your health care provider.   

## 2021-12-11 NOTE — Progress Notes (Addendum)
[facility] ED states patient is nausea and they don't feel she is fine for discharge. Will transfer to [facility] and do ultrasound guided suction d&c due to concern for retained POCs on ultrasound as she does have some blood in the uterine cavity and some vascularity and her Sharene Butters is still 6k from a procedure 5 days ago with a 5/8 quant of 150k; final pathology didn't show any e/o molar pregnancy and anora testing still pending. Pt NPO since 1700. OR front desk called and case posted  Cornelia Copa MD Attending Center for [facility]) GYN Consult Phone: [phone] (M-F, [phone]) & [phone] (Off hours, weekends, holidays) 12/11/2021 Time: 2340

## 2021-12-11 NOTE — ED Triage Notes (Signed)
Had abortion 12/06/2021/ abdominal pain and heavy bleeding .

## 2021-12-11 NOTE — ED Provider Notes (Signed)
[facility] EMERGENCY DEPARTMENT Provider Note   CSN: [id number] Arrival date & time: 12/11/21  1814     History  Chief Complaint  Patient presents with   Abdominal Pain   Vaginal Bleeding    Andrea Hogan is a 26 y.o. female.  HPI     25yo female with history of  D and C 5/16 at [redacted]wk pregnant after missed abortion presents with concern for abdominal pain and vaginal bleeding.   Using padded underwear, super, changed twice today and 4 times last night  Pain started last night around 3AM, lasted about 6 hours, took oxycodone and ibuprofen, didn't help 3PM pain came back, cramping pain, took ibuprofen and not helping Feeling lightheaded No syncope Cramping pain into back Nausea, 2 episodes of emesis No diarrhea/constipation  No fevers   History reviewed. No pertinent past medical history.  Home Medications Prior to Admission medications   Medication Sig Start Date End Date Taking? Authorizing Provider  ondansetron (ZOFRAN-ODT) 4 MG disintegrating tablet Take 1 tablet (4 mg total) by mouth every 8 (eight) hours as needed for nausea or vomiting. 12/11/21  Yes Alvira Monday, MD  oxyCODONE (ROXICODONE) 5 MG immediate release tablet Take 1-2 tablets (5-10 mg total) by mouth every 4 (four) hours as needed for severe pain. 12/11/21  Yes Alvira Monday, MD  ibuprofen (ADVIL) 600 MG tablet Take 1 tablet (600 mg total) by mouth every 8 (eight) hours as needed. 12/12/21   [place] Bing, MD      Allergies    Penicillins    Review of Systems   Review of Systems  Physical Exam Updated Vital Signs BP 118/78   Pulse 78   Temp 97.7 F (36.5 C)   Resp 14   Wt 59 kg   LMP 10/02/2021 (Approximate) Comment: abortion 12/06/21  SpO2 97%   Breastfeeding Unknown   BMI 22.31 kg/m  Physical Exam  ED Results / Procedures / Treatments   Labs (all labs ordered are listed, but only abnormal results are displayed) Labs Reviewed  CBC WITH DIFFERENTIAL/PLATELET -  Abnormal; Notable for the following components:      Result Value   HCT 35.8 (*)    All other components within normal limits  COMPREHENSIVE METABOLIC PANEL - Abnormal; Notable for the following components:   Glucose, Bld 104 (*)    AST 44 (*)    ALT 63 (*)    Total Bilirubin 0.2 (*)    All other components within normal limits  URINALYSIS, ROUTINE W REFLEX MICROSCOPIC - Abnormal; Notable for the following components:   Hgb urine dipstick LARGE (*)    Leukocytes,Ua TRACE (*)    All other components within normal limits  HCG, QUANTITATIVE, PREGNANCY - Abnormal; Notable for the following components:   hCG, Beta Chain, Quant, S 6,987 (*)    All other components within normal limits  URINALYSIS, MICROSCOPIC (REFLEX) - Abnormal; Notable for the following components:   Bacteria, UA FEW (*)    All other components within normal limits  TYPE AND SCREEN  SURGICAL PATHOLOGY    EKG None  Radiology [place] Intraoperative  Result Date: 12/12/2021 CLINICAL DATA:  Ultrasound was provided for use by the ordering physician.  No provider Interpretation or professional fees incurred.    US PELVIC COMPLETE WITH TRANSVAGINAL  Result Date: 12/11/2021 CLINICAL DATA:  Surgical abortion on 12/06/2021.  Vaginal bleeding. EXAM: TRANSABDOMINAL AND TRANSVAGINAL ULTRASOUND OF PELVIS TECHNIQUE: Both transabdominal and transvaginal ultrasound examinations of the pelvis were performed. Transabdominal technique  was performed for global imaging of the pelvis including uterus, ovaries, adnexal regions, and pelvic cul-de-sac. It was necessary to proceed with endovaginal exam following the transabdominal exam to visualize the endometrium and ovaries. COMPARISON:  Pelvic ultrasound dated 11/28/2021. FINDINGS: Uterus Measurements: 9.1 x 4.6 x 4.9 cm = volume: 112 mL. The uterus is retroverted and slightly heterogeneous. Endometrium Thickness: 24 mm. The endometrium is distended. Heterogeneous predominantly hypoechoic content  within the endometrium may represent blood product. Retained product of conception is not excluded correlation with clinical exam and HCG levels recommended. No significant vascularity noted within the endometrial content. Right ovary Not visualized. Left ovary Not visualized. Other findings No abnormal free fluid. IMPRESSION: Distended endometrium containing blood product versus possible retained product of conception. Correlation with clinical exam and HCG levels recommended. Electronically Signed   By: Elgie Collard M.D.   On: 12/11/2021 19:48    Procedures Procedures    Medications Ordered in ED Medications  metroNIDAZOLE (FLAGYL) IVPB 500 mg ( Intravenous Automatically Held 12/27/21 2230)  0.9 %  sodium chloride infusion (has no administration in time range)  fentaNYL (SUBLIMAZE) injection 25-50 mcg (has no administration in time range)  oxyCODONE (Oxy IR/ROXICODONE) 5 MG immediate release tablet (has no administration in time range)  ketorolac (TORADOL) injection 60 mg (60 mg Intramuscular Given 12/11/21 1902)  morphine (PF) 4 MG/ML injection 4 mg (4 mg Intravenous Given 12/11/21 2023)  doxycycline (VIBRA-TABS) tablet 100 mg (100 mg Oral Given 12/11/21 2139)  metroNIDAZOLE (FLAGYL) tablet 500 mg (500 mg Oral Given 12/11/21 2139)  ondansetron (ZOFRAN) injection 4 mg (4 mg Intravenous Given 12/11/21 2139)  doxycycline (VIBRAMYCIN) 100 mg in sodium chloride 0.9 % 250 mL IVPB (100 mg Intravenous New Bag/Given 12/11/21 2346)  lactated ringers bolus 1,000 mL (0 mLs Intravenous Stopped 12/11/21 2345)  ondansetron (ZOFRAN) injection 4 mg (4 mg Intravenous Given 12/11/21 2243)  doxycycline (VIBRAMYCIN) 100 mg in sodium chloride 0.9 % 250 mL IVPB (100 mg Intravenous New Bag/Given 12/12/21 0137)  oxyCODONE (Oxy IR/ROXICODONE) immediate release tablet 5 mg (5 mg Oral Given 12/12/21 0320)    ED Course/ Medical Decision Making/ A&P                           Medical Decision Making Amount and/or Complexity  of Data Reviewed External Data Reviewed: notes. Labs: ordered. Decision-making details documented in ED Course. Radiology: ordered and independent interpretation performed. Decision-making details documented in ED Course.  Risk Prescription drug management.   25yo female with history of  D and C 5/16 at [redacted]wk pregnant after missed abortion presents with concern for abdominal pain and vaginal bleeding.   Hgb normal. Hcg [id number].  Significant pain on pelvic exam.  US shows distended endometrium containing blood and possible retained product of conception.   Concern clinically for retained products given significant pain.  Discussed with Dr. Vergie Living. Given afebrile, hemodynamically stable, initial plan for follow up tomorrow with her OB.  Given doxycycline and flagyl.    Developed nausea and given zofran.  Given po abx however had emesis. Discussed again with Dr. Vergie Living. Given additional zofran, IV fluids, IV abx.  Despite additional zofran she has had continued emesis.  Discussed with Dr. Vergie Living and will take to the OR with concern for retained products of conception.          Final Clinical Impression(s) / ED Diagnoses Final diagnoses:  Vaginal bleeding  Retained products of conception following abortion  Lower abdominal  pain    Rx / DC Orders ED Discharge Orders          Ordered    ibuprofen (ADVIL) 600 MG tablet  Every 8 hours PRN        12/12/21 0219    Discharge patient        12/12/21 0219    doxycycline (VIBRAMYCIN) 100 MG capsule  2 times daily,   Status:  Discontinued        12/11/21 2144    metroNIDAZOLE (FLAGYL) 500 MG tablet  2 times daily,   Status:  Discontinued        12/11/21 2144    ondansetron (ZOFRAN-ODT) 4 MG disintegrating tablet  Every 8 hours PRN        12/11/21 2144    oxyCODONE (ROXICODONE) 5 MG immediate release tablet  Every 4 hours PRN        12/11/21 2144              Alvira MondaySchlossman, Janda Cargo, MD 12/12/21 [phone]

## 2021-12-12 ENCOUNTER — Telehealth (HOSPITAL_BASED_OUTPATIENT_CLINIC_OR_DEPARTMENT_OTHER): Payer: Self-pay | Admitting: *Deleted

## 2021-12-12 ENCOUNTER — Encounter (HOSPITAL_COMMUNITY)
Admission: EM | Disposition: A | Discharge status: Home / Self Care | Payer: Self-pay | Source: Home / Self Care | Attending: Emergency Medicine

## 2021-12-12 ENCOUNTER — Emergency Department (HOSPITAL_COMMUNITY): Payer: Self-pay

## 2021-12-12 ENCOUNTER — Emergency Department (EMERGENCY_DEPARTMENT_HOSPITAL): Payer: Self-pay | Admitting: Registered Nurse

## 2021-12-12 ENCOUNTER — Other Ambulatory Visit: Payer: Self-pay

## 2021-12-12 ENCOUNTER — Emergency Department (HOSPITAL_COMMUNITY): Payer: Self-pay | Admitting: Registered Nurse

## 2021-12-12 ENCOUNTER — Encounter (HOSPITAL_BASED_OUTPATIENT_CLINIC_OR_DEPARTMENT_OTHER): Payer: Self-pay | Admitting: *Deleted

## 2021-12-12 ENCOUNTER — Encounter (HOSPITAL_COMMUNITY): Payer: Self-pay | Admitting: Obstetrics and Gynecology

## 2021-12-12 HISTORY — PX: DILATION AND CURETTAGE OF UTERUS: SHX78

## 2021-12-12 LAB — TYPE AND SCREEN
ABO/RH(D): O POS
Antibody Screen: NEGATIVE

## 2021-12-12 SURGERY — DILATION AND CURETTAGE
Anesthesia: General | Site: Vagina | Laterality: Left

## 2021-12-12 MED ORDER — LIDOCAINE 2% (20 MG/ML) 5 ML SYRINGE
INTRAMUSCULAR | Status: AC
  Filled 2021-12-12: qty 5

## 2021-12-12 MED ORDER — 0.9 % SODIUM CHLORIDE (POUR BTL) OPTIME
TOPICAL | Status: DC | PRN
  Administered 2021-12-12: 1000 mL

## 2021-12-12 MED ORDER — DIPHENHYDRAMINE HCL 50 MG/ML IJ SOLN
INTRAMUSCULAR | Status: DC | PRN
  Administered 2021-12-12: 12.5 mg via INTRAVENOUS

## 2021-12-12 MED ORDER — PROPOFOL 10 MG/ML IV BOLUS
INTRAVENOUS | Status: DC | PRN
  Administered 2021-12-12: 120 mg via INTRAVENOUS

## 2021-12-12 MED ORDER — LIDOCAINE HCL (PF) 1 % IJ SOLN
INTRAMUSCULAR | Status: DC | PRN
  Administered 2021-12-12: 20 mL

## 2021-12-12 MED ORDER — MISOPROSTOL 200 MCG PO TABS: 800.0000 ug | ORAL_TABLET | Freq: Once | ORAL | 0 refills | Status: DC

## 2021-12-12 MED ORDER — LIDOCAINE HCL (PF) 1 % IJ SOLN
INTRAMUSCULAR | Status: AC
  Filled 2021-12-12: qty 30

## 2021-12-12 MED ORDER — FENTANYL CITRATE (PF) 250 MCG/5ML IJ SOLN
INTRAMUSCULAR | Status: DC | PRN
  Administered 2021-12-12: 50 ug via INTRAVENOUS

## 2021-12-12 MED ORDER — KETOROLAC TROMETHAMINE 30 MG/ML IJ SOLN
INTRAMUSCULAR | Status: DC | PRN
  Administered 2021-12-12: 15 mg via INTRAVENOUS

## 2021-12-12 MED ORDER — ONDANSETRON HCL 4 MG/2ML IJ SOLN
INTRAMUSCULAR | Status: DC | PRN
  Administered 2021-12-12: 4 mg via INTRAVENOUS

## 2021-12-12 MED ORDER — OXYCODONE HCL 5 MG PO TABS
5.0000 mg | ORAL_TABLET | Freq: Once | ORAL | Status: AC
  Administered 2021-12-12: 5 mg via ORAL

## 2021-12-12 MED ORDER — MIDAZOLAM HCL 2 MG/2ML IJ SOLN
INTRAMUSCULAR | Status: AC
  Filled 2021-12-12: qty 2

## 2021-12-12 MED ORDER — SODIUM CHLORIDE 0.9 % IV SOLN: INTRAVENOUS | Status: DC

## 2021-12-12 MED ORDER — DIPHENHYDRAMINE HCL 50 MG/ML IJ SOLN
INTRAMUSCULAR | Status: AC
  Filled 2021-12-12: qty 1

## 2021-12-12 MED ORDER — DEXTROSE 5 % IV SOLN: 200.0000 mg | Freq: Once | INTRAVENOUS | Status: DC

## 2021-12-12 MED ORDER — IBUPROFEN 600 MG PO TABS: 600.0000 mg | ORAL_TABLET | Freq: Three times a day (TID) | ORAL | 0 refills | Status: AC | PRN

## 2021-12-12 MED ORDER — DEXAMETHASONE SODIUM PHOSPHATE 10 MG/ML IJ SOLN
INTRAMUSCULAR | Status: AC
  Filled 2021-12-12: qty 1

## 2021-12-12 MED ORDER — SUCCINYLCHOLINE CHLORIDE 200 MG/10ML IV SOSY
PREFILLED_SYRINGE | INTRAVENOUS | Status: AC
  Filled 2021-12-12: qty 10

## 2021-12-12 MED ORDER — FENTANYL CITRATE (PF) 250 MCG/5ML IJ SOLN
INTRAMUSCULAR | Status: AC
  Filled 2021-12-12: qty 5

## 2021-12-12 MED ORDER — SUCCINYLCHOLINE CHLORIDE 200 MG/10ML IV SOSY
PREFILLED_SYRINGE | INTRAVENOUS | Status: DC | PRN
  Administered 2021-12-12: 100 mg via INTRAVENOUS

## 2021-12-12 MED ORDER — SODIUM CHLORIDE 0.9 % IV SOLN
100.0000 mg | Freq: Once | INTRAVENOUS | Status: AC
  Administered 2021-12-12: 100 mg via INTRAVENOUS
  Filled 2021-12-12: qty 100

## 2021-12-12 MED ORDER — KETOROLAC TROMETHAMINE 30 MG/ML IJ SOLN
INTRAMUSCULAR | Status: AC
  Filled 2021-12-12: qty 1

## 2021-12-12 MED ORDER — PROPOFOL 10 MG/ML IV BOLUS
INTRAVENOUS | Status: AC
  Filled 2021-12-12: qty 20

## 2021-12-12 MED ORDER — OXYCODONE HCL 5 MG PO TABS
ORAL_TABLET | ORAL | Status: AC
  Filled 2021-12-12: qty 1

## 2021-12-12 MED ORDER — LACTATED RINGERS IV SOLN: INTRAVENOUS | Status: DC | PRN

## 2021-12-12 MED ORDER — ONDANSETRON HCL 4 MG/2ML IJ SOLN
INTRAMUSCULAR | Status: AC
  Filled 2021-12-12: qty 2

## 2021-12-12 MED ORDER — FENTANYL CITRATE (PF) 100 MCG/2ML IJ SOLN
INTRAMUSCULAR | Status: AC
  Filled 2021-12-12: qty 2

## 2021-12-12 MED ORDER — LIDOCAINE 2% (20 MG/ML) 5 ML SYRINGE
INTRAMUSCULAR | Status: DC | PRN
  Administered 2021-12-12: 60 mg via INTRAVENOUS

## 2021-12-12 MED ORDER — DEXAMETHASONE SODIUM PHOSPHATE 10 MG/ML IJ SOLN
INTRAMUSCULAR | Status: DC | PRN
Start: 2021-12-12 — End: 2021-12-12
  Administered 2021-12-12: 5 mg via INTRAVENOUS

## 2021-12-12 MED ORDER — MIDAZOLAM HCL 2 MG/2ML IJ SOLN
INTRAMUSCULAR | Status: DC | PRN
  Administered 2021-12-12: 2 mg via INTRAVENOUS

## 2021-12-12 MED ORDER — FENTANYL CITRATE (PF) 100 MCG/2ML IJ SOLN: 25.0000 ug | INTRAMUSCULAR | Status: DC | PRN

## 2021-12-12 SURGICAL SUPPLY — 50 items
APPLICATOR COTTON TIP 6 STRL (MISCELLANEOUS) ×1 IMPLANT
APPLICATOR COTTON TIP 6IN STRL (MISCELLANEOUS) ×2 IMPLANT
BLADE SURG 15 STRL LF DISP TIS (BLADE) ×1 IMPLANT
BLADE SURG 15 STRL SS (BLADE)
CABLE HIGH FREQUENCY MONO STRZ (ELECTRODE) IMPLANT
COVER MAYO STAND STRL (DRAPES) ×1 IMPLANT
DEFOGGER SCOPE WARMER CLEARIFY (MISCELLANEOUS) ×1 IMPLANT
DERMABOND ADVANCED (GAUZE/BANDAGES/DRESSINGS)
DERMABOND ADVANCED .7 DNX12 (GAUZE/BANDAGES/DRESSINGS) ×1 IMPLANT
DRAPE HALF SHEET 40X57 (DRAPES) ×1 IMPLANT
DRSG OPSITE POSTOP 3X4 (GAUZE/BANDAGES/DRESSINGS) IMPLANT
DURAPREP 26ML APPLICATOR (WOUND CARE) ×1 IMPLANT
ELECT REM PT RETURN 9FT ADLT (ELECTROSURGICAL)
ELECTRODE REM PT RTRN 9FT ADLT (ELECTROSURGICAL) ×1 IMPLANT
GAUZE 4X4 16PLY ~~LOC~~+RFID DBL (SPONGE) ×1 IMPLANT
GLOVE BIOGEL PI IND STRL 7.0 (GLOVE) ×2 IMPLANT
GLOVE BIOGEL PI IND STRL 7.5 (GLOVE) ×2 IMPLANT
GLOVE BIOGEL PI INDICATOR 7.0 (GLOVE) ×6
GLOVE BIOGEL PI INDICATOR 7.5 (GLOVE) ×2
GLOVE SURG SS PI 7.0 STRL IVOR (GLOVE) ×2 IMPLANT
GOWN STRL REUS W/ TWL LRG LVL3 (GOWN DISPOSABLE) ×3 IMPLANT
GOWN STRL REUS W/TWL LRG LVL3 (GOWN DISPOSABLE) ×3
KIT BASIN OR (CUSTOM PROCEDURE TRAY) ×1 IMPLANT
KIT TURNOVER KIT B (KITS) ×2 IMPLANT
LIGASURE VESSEL 5MM BLUNT TIP (ELECTROSURGICAL) IMPLANT
NDL SPNL 25GX3.5 QUINCKE BL (NEEDLE) IMPLANT
NEEDLE SPNL 25GX3.5 QUINCKE BL (NEEDLE) ×2 IMPLANT
NS IRRIG 1000ML POUR BTL (IV SOLUTION) ×2 IMPLANT
PACK LAPAROSCOPY BASIN (CUSTOM PROCEDURE TRAY) ×1 IMPLANT
PACK LITHOTOMY IV (CUSTOM PROCEDURE TRAY) ×1 IMPLANT
PACK TRENDGUARD 450 HYBRID PRO (MISCELLANEOUS) IMPLANT
PAD OB MATERNITY 4.3X12.25 (PERSONAL CARE ITEMS) ×2 IMPLANT
POUCH LAPAROSCOPIC INSTRUMENT (MISCELLANEOUS) ×2 IMPLANT
POUCH SPECIMEN RETRIEVAL 10MM (ENDOMECHANICALS) IMPLANT
PROTECTOR NERVE ULNAR (MISCELLANEOUS) ×2 IMPLANT
SCISSORS LAP 5X35 DISP (ENDOMECHANICALS) IMPLANT
SET BERKELEY SUCTION TUBING (SUCTIONS) ×1 IMPLANT
SET TUBE SMOKE EVAC HIGH FLOW (TUBING) ×1 IMPLANT
SLEEVE ADV FIXATION 5X100MM (TROCAR) IMPLANT
SPONGE T-LAP 18X18 ~~LOC~~+RFID (SPONGE) ×1 IMPLANT
SUT MON AB 4-0 PS1 27 (SUTURE) ×1 IMPLANT
SUT VICRYL 0 UR6 27IN ABS (SUTURE) ×1 IMPLANT
SYR 10ML LL (SYRINGE) ×1 IMPLANT
SYR CONTROL 10ML LL (SYRINGE) ×1 IMPLANT
TOWEL GREEN STERILE FF (TOWEL DISPOSABLE) ×4 IMPLANT
TRAY FOLEY W/BAG SLVR 14FR (SET/KITS/TRAYS/PACK) ×2 IMPLANT
TRENDGUARD 450 HYBRID PRO PACK (MISCELLANEOUS) ×2
TROCAR ADV FIXATION 5X100MM (TROCAR) IMPLANT
TROCAR BALLN 12MMX100 BLUNT (TROCAR) IMPLANT
WARMER LAPAROSCOPE (MISCELLANEOUS) ×1 IMPLANT

## 2021-12-12 NOTE — H&P (Signed)
Obstetrics & Gynecology H&P   Date of Admission: 12/12/2021   Requesting Provider: [facility] ED  Primary OBGYN: [facility] for [facility] Primary Care Provider: Pcp, No  Reason for Admission: possible retained POCs  History of Present Illness: Andrea Hogan is a 26 y.o. G1P0010 (Patient's last menstrual period was 10/02/2021 (approximate).), with the above CC. PMHx is significant for recent 5/16 suction d&c for missed AB at 8 weeks.  Patient presented to [facility] w/ abdominal pain and bleeding and u/s with ?retained POCs. I was called and felt pt stable for outpatient follow up with her primary GYN and to go home on doxy/flagyl for any potential brewing infection but pt very nauseous and throwing up and ED didn't feel she was stable for d/c to home so I asked them to transfer her here for surgery  Patient with mild belly cramping, no VB and states nausea is better currently  ROS: A 12-point review of systems was performed and negative, except as stated in the above HPI.  OBGYN History: As per HPI.  Past Medical History: History reviewed. No pertinent past medical history.  Past Surgical History: Past Surgical History:  Procedure Laterality Date   DILATION AND EVACUATION N/A 12/06/2021   Procedure: DILATATION AND EVACUATION;  Surgeon: Jerene Bears, MD;  Location: [facility];  Service: Gynecology;  Laterality: N/A;    Family History:  No family history on file.  Social History:  Social History   Socioeconomic History   Marital status: Single    Spouse name: Not on file   Number of children: Not on file   Years of education: Not on file   Highest education level: Not on file  Occupational History   Not on file  Tobacco Use   Smoking status: Never   Smokeless tobacco: Never  Substance and Sexual Activity   Alcohol use: Not Currently    Comment: occ   Drug use: Never   Sexual activity: Yes  Other Topics Concern   Not on file   Social History Narrative   Not on file   Social Determinants of Health   Financial Resource Strain: Not on file  Food Insecurity: Not on file  Transportation Needs: Not on file  Physical Activity: Not on file  Stress: Not on file  Social Connections: Not on file  Intimate Partner Violence: Not on file     Allergy: Allergies  Allergen Reactions   Penicillins     Current Outpatient Medications: Medications Prior to Admission  Medication Sig Dispense Refill Last Dose   ibuprofen (ADVIL) 800 MG tablet Take 1 tablet (800 mg total) by mouth every 8 (eight) hours as needed. 30 tablet 0      Hospital Medications: Current Facility-Administered Medications  Medication Dose Route Frequency Provider Last Rate Last Admin   0.9 %  sodium chloride infusion   Intravenous Continuous Aleesa Sweigert, MD       0.9 % irrigation (POUR BTL)    PRN [place] Bing, MD   1,000 mL at 12/12/21 0043   doxycycline (VIBRAMYCIN) 100 mg in sodium chloride 0.9 % 250 mL IVPB  100 mg Intravenous Once Alvira Monday, MD 125 mL/hr at 12/11/21 2346 100 mg at 12/11/21 2346   doxycycline (VIBRAMYCIN) 100 mg in sodium chloride 0.9 % 250 mL IVPB  100 mg Intravenous Once [place] Bing, MD       [MAR Hold] metroNIDAZOLE (FLAGYL) IVPB 500 mg  500 mg Intravenous Q12H Alvira Monday, MD   Stopped  at 12/11/21 2345     Physical Exam:   Current Vital Signs 24h Vital Sign Ranges  T 98.4 F (36.9 C) Temp  Avg: 98.4 F (36.9 C)  Min: 98.4 F (36.9 C)  Max: 98.4 F (36.9 C)  BP (!) 129/91 BP  Min: 113/69  Max: 129/91  HR 75 Pulse  Avg: 79.7  Min: 71  Max: 88  RR 17 Resp  Avg: 16.5  Min: 16  Max: 17  SaO2 100 % Room Air SpO2  Avg: 99.8 %  Min: 99 %  Max: 100 %       24 Hour I/O Current Shift I/O  Time Ins Outs No intake/output data recorded. No intake/output data recorded.   Patient Vitals for the past 24 hrs:  BP Temp Temp src Pulse Resp SpO2 Weight  12/12/21 0000 (!) 129/91 -- -- 75 17 100 % --   12/11/21 2330 117/78 -- -- 75 16 100 % --  12/11/21 2300 113/69 -- -- 85 -- 100 % --  12/11/21 2230 115/72 -- -- 79 -- 99 % --  12/11/21 2200 121/65 -- -- 85 17 99 % --  12/11/21 2130 118/77 -- -- 82 -- 100 % --  12/11/21 2127 117/77 -- -- 77 16 100 % --  12/11/21 2100 -- -- -- 88 -- 100 % --  12/11/21 2045 -- -- -- 77 -- 100 % --  12/11/21 2030 -- -- -- 84 -- 100 % --  12/11/21 2024 -- -- -- 71 -- 100 % --  12/11/21 2023 115/69 -- -- 71 17 100 % --  12/11/21 1824 117/66 98.4 F (36.9 C) Oral 87 16 100 % --  12/11/21 1819 -- -- -- -- -- -- 59 kg    Body mass index is 22.31 kg/m. General appearance: Well nourished, well developed female in no acute distress.  Neck:  Supple, normal appearance, and no thyromegaly  Cardiovascular: S1, S2 normal, no murmur, rub or gallop, regular rate and rhythm Respiratory:  Clear to auscultation bilateral. Normal respiratory effort Abdomen: soft nttp Neuro/Psych:  Normal mood and affect.  Skin:  Warm and dry.  Extremities: no clubbing, cyanosis, or edema.    Laboratory: Beta HCG: 6987 at 1940 on 5/21  Recent Labs  Lab 12/06/21 1013 12/11/21 1940  WBC 7.6 9.3  HGB 13.3 12.4  HCT 36.7 35.8*  PLT 258 280   Recent Labs  Lab 12/11/21 1940  NA 136  K 4.0  CL 107  CO2 22  BUN 8  CREATININE 0.47  CALCIUM 9.4  PROT 7.5  BILITOT 0.2*  ALKPHOS 71  ALT 63*  AST 44*  GLUCOSE 104*   No results for input(s): APTT, INR, PTT in the last 168 hours.  Invalid input(s): DRHAPTT Recent Labs  Lab 12/06/21 0949  ABORH O POS    Imaging:  Narrative & Impression  CLINICAL DATA:  Surgical abortion on 12/06/2021.  Vaginal bleeding.   EXAM: TRANSABDOMINAL AND TRANSVAGINAL ULTRASOUND OF PELVIS   TECHNIQUE: Both transabdominal and transvaginal ultrasound examinations of the pelvis were performed. Transabdominal technique was performed for global imaging of the pelvis including uterus, ovaries, adnexal regions, and pelvic cul-de-sac. It was  necessary to proceed with endovaginal exam following the transabdominal exam to visualize the endometrium and ovaries.   COMPARISON:  Pelvic ultrasound dated 11/28/2021.   FINDINGS: Uterus   Measurements: 9.1 x 4.6 x 4.9 cm = volume: 112 mL. The uterus is retroverted and slightly heterogeneous.   Endometrium  Thickness: 24 mm. The endometrium is distended. Heterogeneous predominantly hypoechoic content within the endometrium may represent blood product. Retained product of conception is not excluded correlation with clinical exam and HCG levels recommended. No significant vascularity noted within the endometrial content.   Right ovary   Not visualized.   Left ovary   Not visualized.   Other findings   No abnormal free fluid.   IMPRESSION: Distended endometrium containing blood product versus possible retained product of conception. Correlation with clinical exam and HCG levels recommended.     Electronically Signed   By: Elgie Collard M.D.   On: 12/11/2021 19:48      Assessment: Ms. Stefanik is a 26 y.o. G1P0010 with possible retained POCs. Pt stable  Plan: D/w her and she is amenable to suction d&c. Will do via u/s guidance. I told her that she should still be fine for d/c to home   Cornelia Copa MD Attending Center for [facility]) GYN Consult Phone: [phone] (M-F, 0800-1700) & [phone] (Off hours, weekends, holidays)

## 2021-12-12 NOTE — Anesthesia Procedure Notes (Signed)

## 2021-12-12 NOTE — Op Note (Signed)
Operative Note   12/12/2021  PRE-OP DIAGNOSIS: concern for retained products of conception   POST-OP DIAGNOSIS: same  SURGEON: Surgeon(s) and Role:    [place] Bing, MD - Primary  ASSISTANT: None  PROCEDURE:  Suction dilation and curettage via ultrasound guidance  ANESTHESIA: General and paracervical block  ESTIMATED BLOOD LOSS: 75mL  DRAINS: none  TOTAL IV FLUIDS: per anesthesia note  SPECIMENS: products of conception to pathology  VTE PROPHYLAXIS: SCDs to the bilateral lower extremities  ANTIBIOTICS: Doxycycline 100mg  IV x 1 pre op  COMPLICATIONS: none  DISPOSITION: PACU - hemodynamically stable.  CONDITION: stable  BLOOD TYPE: O POS. Rhogam given:n/a  FINDINGS: Old blood and shaggy appearance on ultrasound consistent with what was seen in the ED. Exam under anesthesia revealed 6-8 week sized uterus with no masses and bilateral adnexa without masses or fullness. Large amount of necrotic appearing products of conception were seen, with gritty texture in all four quadrants. Thin, avascular stripe noted at the end of the procedure  PROCEDURE IN DETAIL:  After informed consent was obtained, the patient was taken to the operating room where anesthesia was obtained without difficulty. The patient was positioned in the dorsal lithotomy position in Heeney stirrups. The patient was examined under anesthesia, with the above noted findings.  The bi-valved speculum was placed inside the patient's vagina, and the the anterior lip of the cervix was seen and grasped with the tenaculum.  A paracervical block was achieved with 108mL of 1% lidocaine and then the cervix was already able to pass a #10 cannula. The suction was then calibrated to 30m and connected to the number 10 cannula, which was then introduced with the above noted findings. A gentle curettage was done at the end and yield no products of conception.   The suction was then done one more time to remove any remaining  curettage material.   Excellent hemostasis was noted, and all instruments were removed, with excellent hemostasis noted throughout.  She was then taken out of dorsal lithotomy. The patient tolerated the procedure well.  Sponge, lap and instrument counts were correct x2.  The patient was taken to recovery room in excellent condition.  MD Attending Center for Cornelia Copa [employer])

## 2021-12-12 NOTE — Transfer of Care (Signed)
Immediate Anesthesia Transfer of Care Note  Patient: Andrea Hogan  Procedure(s) Performed: SUCTION DILATATION AND CURETTAGE WITH ULTRASOUND (Left: Vagina )  Patient Location: PACU  Anesthesia Type:General  Level of Consciousness: awake, alert , oriented and drowsy  Airway & Oxygen Therapy: Patient Spontanous Breathing and Patient connected to nasal cannula oxygen  Post-op Assessment: Report given to RN and Post -op Vital signs reviewed and stable  Post vital signs: Reviewed and stable  Last Vitals:  Vitals Value Taken Time  BP 109/79 12/12/21 0233  Temp    Pulse 86 12/12/21 0234  Resp 15 12/12/21 0234  SpO2 95 % 12/12/21 0234  Vitals shown include unvalidated device data.  Last Pain:  Vitals:   12/11/21 2109  TempSrc:   PainSc: 3          Complications: No notable events documented.

## 2021-12-12 NOTE — Progress Notes (Signed)
[facility] Discharge Note  Patient instuctions were given to family. Wound care, diet, pain, follow up care and how and whom to contact with concerns were discussed. Family aware someone needs to remain with patient overnight and concerns after receiving anesthesia and what to avoid and safety. Answered all questions and concerns. Nothing per vagina for 6 weeks. Need to call in morning for follow up appointment.   Discharge paperwork has clear contact informations for surgeon and 24 hour RN line for concerns.   Discussed what concerns to look for including infection and signs/symptoms to look for.   IV was removed prior to discharge. Patient was brought to car with belongings.   Pt exits my care.

## 2021-12-12 NOTE — Progress Notes (Signed)
[facility] Nursing Note  Patient's blood type is O+. She does not need Rhogam.   Lattie Haw RN Pac[facility]

## 2021-12-12 NOTE — Anesthesia Preprocedure Evaluation (Signed)
Anesthesia Evaluation  Patient identified by MRN, date of birth, ID band Patient awake    Reviewed: Allergy & Precautions, H&P , NPO status , Patient's Chart, lab work & pertinent test results  Airway Mallampati: III  TM Distance: >3 FB Neck ROM: Full    Dental no notable dental hx. (+) Teeth Intact, Dental Advisory Given   Pulmonary neg pulmonary ROS,    Pulmonary exam normal breath sounds clear to auscultation       Cardiovascular negative cardio ROS   Rhythm:Regular Rate:Normal     Neuro/Psych negative neurological ROS  negative psych ROS   GI/Hepatic negative GI ROS, Neg liver ROS,   Endo/Other  negative endocrine ROS  Renal/GU negative Renal ROS  negative genitourinary   Musculoskeletal   Abdominal   Peds  Hematology negative hematology ROS (+)   Anesthesia Other Findings   Reproductive/Obstetrics negative OB ROS                             Anesthesia Physical Anesthesia Plan  ASA: 1 and emergent  Anesthesia Plan: General   Post-op Pain Management: Toradol IV (intra-op)*   Induction: Intravenous, Rapid sequence and Cricoid pressure planned  PONV Risk Score and Plan: 4 or greater and Ondansetron, Dexamethasone and Midazolam  Airway Management Planned: Oral ETT  Additional Equipment:   Intra-op Plan:   Post-operative Plan: Extubation in OR  Informed Consent: I have reviewed the patients History and Physical, chart, labs and discussed the procedure including the risks, benefits and alternatives for the proposed anesthesia with the patient or authorized representative who has indicated his/her understanding and acceptance.     Dental advisory given  Plan Discussed with: CRNA  Anesthesia Plan Comments:         Anesthesia Quick Evaluation

## 2021-12-12 NOTE — Telephone Encounter (Signed)
LMOVM for pt to call office 

## 2021-12-13 ENCOUNTER — Ambulatory Visit (INDEPENDENT_AMBULATORY_CARE_PROVIDER_SITE_OTHER): Payer: Self-pay | Admitting: Obstetrics & Gynecology

## 2021-12-13 ENCOUNTER — Encounter (HOSPITAL_COMMUNITY): Payer: Self-pay | Admitting: Obstetrics and Gynecology

## 2021-12-13 VITALS — BP 101/61 | HR 81 | Ht 63.0 in | Wt 129.2 lb

## 2021-12-13 DIAGNOSIS — Z30011 Encounter for initial prescription of contraceptive pills: Secondary | ICD-10-CM

## 2021-12-13 DIAGNOSIS — Z9889 Other specified postprocedural states: Secondary | ICD-10-CM

## 2021-12-13 LAB — SURGICAL PATHOLOGY

## 2021-12-13 MED ORDER — NORETHIN ACE-ETH ESTRAD-FE 1-20 MG-MCG PO TABS: 1.0000 | ORAL_TABLET | Freq: Every day | ORAL | 3 refills | Status: DC

## 2021-12-13 MED ORDER — OXYCODONE-ACETAMINOPHEN 5-325 MG PO TABS: 1.0000 | ORAL_TABLET | Freq: Four times a day (QID) | ORAL | 0 refills | Status: AC | PRN

## 2021-12-13 NOTE — Anesthesia Postprocedure Evaluation (Signed)
Anesthesia Post Note  Patient: Surveyor, quantity  Procedure(s) Performed: SUCTION DILATATION AND CURETTAGE WITH ULTRASOUND (Left: Vagina )     Patient location during evaluation: PACU Anesthesia Type: General Level of consciousness: awake and alert Pain management: pain level controlled Vital Signs Assessment: post-procedure vital signs reviewed and stable Respiratory status: spontaneous breathing, nonlabored ventilation and respiratory function stable Cardiovascular status: blood pressure returned to baseline and stable Postop Assessment: no apparent nausea or vomiting Anesthetic complications: no   No notable events documented.  Last Vitals:  Vitals:   12/12/21 0255 12/12/21 0300  BP:    Pulse: 72 78  Resp: 14 14  Temp:    SpO2: 98% 97%    Last Pain:  Vitals:   12/12/21 0250  TempSrc:   PainSc: 0-No pain                 Syncere Eble,W. EDMOND

## 2021-12-13 NOTE — Patient Instructions (Addendum)
[address] [place], [place] [address]   [phone]

## 2021-12-14 DIAGNOSIS — Z30011 Encounter for initial prescription of contraceptive pills: Secondary | ICD-10-CM | POA: Insufficient documentation

## 2021-12-14 NOTE — Progress Notes (Signed)
GYNECOLOGY  VISIT  CC:   post op recheck  HPI: 26 y.o. G1P0000 Single Other or two or more races female here for recheck after undergoing D&C on 12/06/2021.  She went to the ER over the weekend with increased pain and bleeding.  Possible retained products noted.  She had repeat procedure. Pathology is pending.  Bleeding is minimal now. She is having cramping.  Does not like how pain medication is making her feel.  Had Percocet from last week's procedure.  Has used this.  Would like new prescription.  Also on Doxycycline and flagyl.  Has started taking these.  Denies fever.    We did discuss contraception. She is not interested in having another pregnancy any time soon.  Has not been on anything except Nexplanon.  Did not like the irregular bleeding with this.  Not interested in IUD.  Considering pills.  Different types of pills discussed.  Feels comfortable starting combination OCPs.  Timing for starting discussed.  Risks discussed with pt in detail including DUB, DVT/PE, headache, nausea, increased BP.    Pathology reviewed:  Yes .  Questions answered.    MEDS:   Current Outpatient Medications on File Prior to Visit  Medication Sig Dispense Refill   ibuprofen (ADVIL) 600 MG tablet Take 1 tablet (600 mg total) by mouth every 8 (eight) hours as needed. 10 tablet 0   ondansetron (ZOFRAN-ODT) 4 MG disintegrating tablet Take 1 tablet (4 mg total) by mouth every 8 (eight) hours as needed for nausea or vomiting. 20 tablet 0   No current facility-administered medications on file prior to visit.    SH:  Smoking No    PHYSICAL EXAMINATION:    BP 101/61   Pulse 81   Ht 5\' 3"  (1.6 m)   Wt 129 lb 3.2 oz (58.6 kg)   Breastfeeding No   BMI 22.89 kg/m     General appearance: alert, cooperative and appears stated age CV:  Regular rate and rhythm Lungs:  clear to auscultation, no wheezes, rales or rhonchi, symmetric air entry Abdomen: soft, non-tender; bowel sounds normal; no masses,  no  organomegaly  Assessment/Plan: 1. Post-operative state - recheck 1 month.  Precautions given to call for fever, increased pain, unusual vaginal discharge or other concerns - oxyCODONE-acetaminophen (PERCOCET) 5-325 MG tablet; Take 1-2 tablets by mouth every 6 (six) hours as needed for up to 5 days. use only as much as needed to relieve pain  Dispense: 12 tablet; Refill: 0  2. Encounter for initial prescription of contraceptive pills - after discussion of options, decided to start OCPs.  Will start in 1 week.   - norethindrone-ethinyl estradiol-FE (LOESTRIN FE) 1-20 MG-MCG tablet; Take 1 tablet by mouth daily.  Dispense: 28 tablet; Refill: 3

## 2021-12-28 ENCOUNTER — Encounter: Payer: Self-pay | Admitting: Family Medicine

## 2021-12-28 ENCOUNTER — Ambulatory Visit (INDEPENDENT_AMBULATORY_CARE_PROVIDER_SITE_OTHER): Payer: Self-pay | Admitting: Family Medicine

## 2021-12-28 ENCOUNTER — Encounter: Payer: Self-pay | Admitting: Certified Nurse Midwife

## 2021-12-28 VITALS — BP 116/83 | HR 90 | Ht 63.0 in | Wt 127.5 lb

## 2021-12-28 DIAGNOSIS — O0289 Other abnormal products of conception: Secondary | ICD-10-CM

## 2021-12-28 DIAGNOSIS — Z9889 Other specified postprocedural states: Secondary | ICD-10-CM

## 2021-12-28 NOTE — Progress Notes (Signed)
GYNECOLOGY OFFICE VISIT NOTE  History:   Andrea Hogan is a 26 y.o. G1P0000 here today for follow up.  I saw patient on 11/29/2021, at that time had already received diagnosis of missed AB Decided on medication management during visit, but next day opted for [facility], path c/w POC Had uncomplicated D&C on 12/06/2021, however went back to ED on 12/11/2021 with abdominal pain and fever and US showed concern for ongoing retained POC She was transferred to [facility] and had uncomplicated repeat D&C, path again with some chorionic villi Path benign in both instances  Today reports ongoing intermittent abdominal cramping and intermittent bleeding Also wondering why she needed two procedures Wondering if ongoing symptoms mean something is wrong again Worried she has been losing weight and has no appetite Wondering when [facility] testing will return  Weight: 127 lb 8 oz (57.8 kg)  Wt Readings from Last 3 Encounters:  12/28/21 127 lb 8 oz (57.8 kg)  12/13/21 129 lb 3.2 oz (58.6 kg)  12/11/21 130 lb (59 kg)     Health Maintenance Due  Topic Date Due   COVID-19 Vaccine (1) Never done   HPV VACCINES (1 - 2-dose series) Never done   HIV Screening  Never done   Hepatitis C Screening  Never done   TETANUS/TDAP  Never done   PAP-Cervical Cytology Screening  Never done   PAP SMEAR-Modifier  Never done    No past medical history on file.  Past Surgical History:  Procedure Laterality Date   DILATION AND CURETTAGE OF UTERUS Left 12/12/2021   Procedure: SUCTION DILATATION AND CURETTAGE WITH ULTRASOUND;  Surgeon: Creswell Bing, MD;  Location: [facility] OR;  Service: Gynecology;  Laterality: Left;   DILATION AND EVACUATION N/A 12/06/2021   Procedure: DILATATION AND EVACUATION;  Surgeon: Jerene Bears, MD;  Location: [facility];  Service: Gynecology;  Laterality: N/A;    The following portions of the patient's history were reviewed and updated as appropriate: allergies, current medications, past  family history, past medical history, past social history, past surgical history and problem list.   Health Maintenance:   Last pap: No results found for: "DIAGPAP", "HPV", "HPVHIGH" Reports normal at [facility] last year  Last mammogram:  N/a    Review of Systems:  Pertinent items noted in HPI and remainder of comprehensive ROS otherwise negative.  Physical Exam:  BP 116/83   Pulse 90   Ht 5\' 3"  (1.6 m)   Wt 127 lb 8 oz (57.8 kg)   LMP  (LMP Unknown)   Breastfeeding No   BMI 22.59 kg/m  CONSTITUTIONAL: Well-developed, well-nourished female in no acute distress.  HEENT:  Normocephalic, atraumatic. External right and left ear normal. No scleral icterus.  NECK: Normal range of motion, supple, no masses noted on observation SKIN: No rash noted. Not diaphoretic. No erythema. No pallor. MUSCULOSKELETAL: Normal range of motion. No edema noted. NEUROLOGIC: Alert and oriented to person, place, and time. Normal muscle tone coordination.  PSYCHIATRIC: Normal mood and affect. Normal behavior. Normal judgment and thought content. RESPIRATORY: Effort normal, no problems with respiration noted ABDOMEN: No masses noted. No other overt distention noted.  Nontender to palpation   Labs and Imaging No results found for this or any previous visit (from the past 168 hour(s)). Intraoperative  Result Date: 12/12/2021 CLINICAL DATA:  Ultrasound was provided for use by the ordering physician.  No provider Interpretation or professional fees incurred.    12/14/2021 PELVIC COMPLETE WITH TRANSVAGINAL  Result Date: 12/11/2021  CLINICAL DATA:  Surgical abortion on 12/06/2021.  Vaginal bleeding. EXAM: TRANSABDOMINAL AND TRANSVAGINAL ULTRASOUND OF PELVIS TECHNIQUE: Both transabdominal and transvaginal ultrasound examinations of the pelvis were performed. Transabdominal technique was performed for global imaging of the pelvis including uterus, ovaries, adnexal regions, and pelvic cul-de-sac. It was necessary to proceed  with endovaginal exam following the transabdominal exam to visualize the endometrium and ovaries. COMPARISON:  Pelvic ultrasound dated 11/28/2021. FINDINGS: Uterus Measurements: 9.1 x 4.6 x 4.9 cm = volume: 112 mL. The uterus is retroverted and slightly heterogeneous. Endometrium Thickness: 24 mm. The endometrium is distended. Heterogeneous predominantly hypoechoic content within the endometrium may represent blood product. Retained product of conception is not excluded correlation with clinical exam and HCG levels recommended. No significant vascularity noted within the endometrial content. Right ovary Not visualized. Left ovary Not visualized. Other findings No abnormal free fluid. IMPRESSION: Distended endometrium containing blood product versus possible retained product of conception. Correlation with clinical exam and HCG levels recommended. Electronically Signed   By: Elgie Collard M.D.   On: 12/11/2021 19:48      Assessment and Plan:   Problem List Items Addressed This Visit       Other   Post-operative state    Discussed with patient that ongoing pain and mild vaginal bleeding are common in post op state, especially after having back to back D&C procedures. Reviewed that I do not believe this is indicative of any serious pathology, especially with benign abdominal exam. Also reviewed that need for a second procedure was simply an unforseen complication that could not have been predicted. Encouraged continued use of ibuprofen/tylenol, reviewed weight trend and has only lost max 3 pounds over the past month, reassurance provided. Follow up PRN for issues, she has upcoming appt with Dr. Hyacinth Meeker and I encouraged her to follow up with her. Also wondering about ANORA testing, result not yet in our system, encouraged her to follow up w Dr. Hyacinth Meeker about this as well.       Other Visit Diagnoses     Nonviable pregnancy    -  Primary       Routine preventative health maintenance measures  emphasized. Please refer to After Visit Summary for other counseling recommendations.   No follow-ups on file.    Total face-to-face time with patient: 20 minutes.  Over 50% of encounter was spent on counseling and coordination of care.   Venora Maples, MD/MPH Attending Family Medicine Physician, [facility] for [facility], [facility]

## 2021-12-30 ENCOUNTER — Encounter: Payer: Self-pay | Admitting: Family Medicine

## 2021-12-30 DIAGNOSIS — Z9889 Other specified postprocedural states: Secondary | ICD-10-CM | POA: Insufficient documentation

## 2021-12-30 NOTE — Assessment & Plan Note (Signed)
Discussed with patient that ongoing pain and mild vaginal bleeding are common in post op state, especially after having back to back D&C procedures. Reviewed that I do not believe this is indicative of any serious pathology, especially with benign abdominal exam. Also reviewed that need for a second procedure was simply an unforseen complication that could not have been predicted. Encouraged continued use of ibuprofen/tylenol, reviewed weight trend and has only lost max 3 pounds over the past month, reassurance provided. Follow up PRN for issues, she has upcoming appt with Dr. Sabra Heck and I encouraged her to follow up with her. Also wondering about ANORA testing, result not yet in our system, encouraged her to follow up w Dr. Sabra Heck about this as well.

## 2022-01-09 ENCOUNTER — Encounter (HOSPITAL_BASED_OUTPATIENT_CLINIC_OR_DEPARTMENT_OTHER): Payer: Self-pay | Admitting: Obstetrics & Gynecology

## 2022-01-11 ENCOUNTER — Encounter (HOSPITAL_BASED_OUTPATIENT_CLINIC_OR_DEPARTMENT_OTHER): Payer: Self-pay | Admitting: *Deleted

## 2022-01-16 ENCOUNTER — Telehealth (HOSPITAL_BASED_OUTPATIENT_CLINIC_OR_DEPARTMENT_OTHER): Payer: Self-pay | Admitting: Obstetrics & Gynecology

## 2022-01-16 ENCOUNTER — Encounter (HOSPITAL_BASED_OUTPATIENT_CLINIC_OR_DEPARTMENT_OTHER): Payer: Self-pay | Admitting: Obstetrics & Gynecology

## 2022-01-19 ENCOUNTER — Ambulatory Visit (INDEPENDENT_AMBULATORY_CARE_PROVIDER_SITE_OTHER): Payer: Self-pay | Admitting: Obstetrics & Gynecology

## 2022-01-19 ENCOUNTER — Telehealth (HOSPITAL_BASED_OUTPATIENT_CLINIC_OR_DEPARTMENT_OTHER): Payer: Self-pay

## 2022-01-19 ENCOUNTER — Encounter (HOSPITAL_BASED_OUTPATIENT_CLINIC_OR_DEPARTMENT_OTHER): Payer: Self-pay | Admitting: Obstetrics & Gynecology

## 2022-01-19 VITALS — BP 114/69 | HR 72 | Ht 63.0 in | Wt 129.8 lb

## 2022-01-19 DIAGNOSIS — Z30011 Encounter for initial prescription of contraceptive pills: Secondary | ICD-10-CM

## 2022-01-19 DIAGNOSIS — Z9889 Other specified postprocedural states: Secondary | ICD-10-CM

## 2022-01-19 DIAGNOSIS — Z124 Encounter for screening for malignant neoplasm of cervix: Secondary | ICD-10-CM

## 2022-01-19 DIAGNOSIS — N926 Irregular menstruation, unspecified: Secondary | ICD-10-CM

## 2022-01-19 MED ORDER — NORETHINDRONE ACET-ETHINYL EST 1.5-30 MG-MCG PO TABS: 1.0000 | ORAL_TABLET | Freq: Every day | ORAL | 3 refills | Status: DC

## 2022-01-19 MED ORDER — NORETHIN ACE-ETH ESTRAD-FE 1-20 MG-MCG PO TABS
1.0000 | ORAL_TABLET | Freq: Every day | ORAL | 3 refills | Status: AC
Start: 2022-01-19 — End: ?

## 2022-01-19 NOTE — Telephone Encounter (Signed)
Called pharmacy to cancel recently sent in Rx for Loestrin 1.5mg  per Dr. Hyacinth Meeker. Spoke with Morrie Sheldon who states medication has not been processed through their system yet. She will cancel this medication out once it does process. tbw

## 2022-01-19 NOTE — Progress Notes (Addendum)
GYNECOLOGY  VISIT  CC:   recheck after D&E  HPI: 26 y.o. G1P0000 Single Other or two or more races female here for follow up.  Had D&E due to missed abortion.  Needed repeat procedure due to retained products.  No vaginal discharge.  Pain/cramping from procedure has resolved.  Did start OCP and is on cycle now.  Had some irregular bleeding with first pack.  Does have RFs.  Recommended that she continue with current OCP and let me know if have irregular spotting with next month or two.  Would recommending changing Rx after another month or two if irregular spotting continues.  She is having some un-related hip pain.  Quit her job because she felt this was interfering and possibly no accomodation could be made for pt.  She has recently requested additional pain medication for this. Advised should seek additional evaluation for this.    Has not applied for Medicaid yet but does have the documentation she needs.  Has an appt today.  Asked pt to let us know if needs anything else after appt.  Pt's history reviewed.  No pap smear results in Epic or in Care Everywhere.  Pt thinks she had one.  Recommended obtaining pap smear today and requesting records.  Can discard if has been done.  Pt comfortable with plan.    No past medical history on file.  MEDS:   Current Outpatient Medications on File Prior to Visit  Medication Sig Dispense Refill   ibuprofen (ADVIL) 600 MG tablet Take 1 tablet (600 mg total) by mouth every 8 (eight) hours as needed. 10 tablet 0   norethindrone-ethinyl estradiol-FE (LOESTRIN FE) 1-20 MG-MCG tablet Take 1 tablet by mouth daily. 28 tablet 3   No current facility-administered medications on file prior to visit.    ALLERGIES: Oxycodone and Penicillins  SH:  single, non smoker  Review of Systems  Constitutional: Negative.   Genitourinary: Negative.     PHYSICAL EXAMINATION:    BP 114/69 (BP Location: Left Arm, Patient Position: Sitting, Cuff Size: Normal)   Pulse 72    Ht 5\' 3"  (1.6 m) Comment: Reported  Wt 129 lb 12.8 oz (58.9 kg)   LMP 09/29/2021   BMI 22.99 kg/m     General appearance: alert, cooperative and appears stated age Abdomen: soft, non-tender; bowel sounds normal; no masses,  no organomegaly Lymph:  no inguinal LAD noted  Pelvic: External genitalia:  no lesions              Urethra:  normal appearing urethra with no masses, tenderness or lesions              Bartholins and Skenes: normal                 Vagina: normal appearing vagina with normal color and discharge, no lesions              Cervix: no lesions              Bimanual Exam:  Uterus:  normal size, contour, position, consistency, mobility, non-tender              Adnexa: no mass, fullness, tenderness          Chaperone, 11/29/2021, RN, was present for exam.  Assessment/Plan: 1. Post-operative state - doing well from surgical standpoint - feel hip/leg issues is unrelated.  No additional pain medications ordered at this time.  Additional evaluation recommended.  2. Encounter for initial prescription of  contraceptive pills - continue current OCPs.  Advised to call and give update after another month or two - norethindrone-ethinyl estradiol-FE (LOESTRIN FE) 1-20 MG-MCG tablet; Take 1 tablet by mouth daily.  Dispense: 28 tablet; Refill: 3  3. Irregular bleeding  4. Cervical cancer screening - pap smear obtained and held.  Release signed for records.  Will order pap smear if no recent pap smear has been done and pt will be notified.

## 2022-01-20 ENCOUNTER — Other Ambulatory Visit (HOSPITAL_BASED_OUTPATIENT_CLINIC_OR_DEPARTMENT_OTHER): Payer: Self-pay | Admitting: Obstetrics & Gynecology

## 2022-01-20 ENCOUNTER — Other Ambulatory Visit (HOSPITAL_COMMUNITY)
Admission: RE | Admit: 2022-01-20 | Discharge: 2022-01-20 | Disposition: A | Discharge status: Home / Self Care | Payer: Self-pay | Source: Ambulatory Visit | Attending: Obstetrics & Gynecology | Admitting: Obstetrics & Gynecology

## 2022-01-20 DIAGNOSIS — Z124 Encounter for screening for malignant neoplasm of cervix: Secondary | ICD-10-CM | POA: Insufficient documentation

## 2022-01-23 LAB — CYTOLOGY - PAP: Diagnosis: NEGATIVE

## 2024-02-26 IMAGING — US US PELVIS COMPLETE WITH TRANSVAGINAL
1 series · 13 of 25 positions shown · non-contrast
Comparison: Pelvic ultrasound dated 11/28/2021.

CLINICAL DATA: Surgical abortion on 12/06/2021.  Vaginal bleeding.

EXAM:
TRANSABDOMINAL AND TRANSVAGINAL ULTRASOUND OF PELVIS
TECHNIQUE: Both transabdominal and transvaginal ultrasound examinations of the
pelvis were performed. Transabdominal technique was performed for
global imaging of the pelvis including uterus, ovaries, adnexal
regions, and pelvic cul-de-sac. It was necessary to proceed with
endovaginal exam following the transabdominal exam to visualize the
endometrium and ovaries.

[Series 1: us pelvis complete with transvaginal · 13 of 68 slices shown]
[im 1/68]
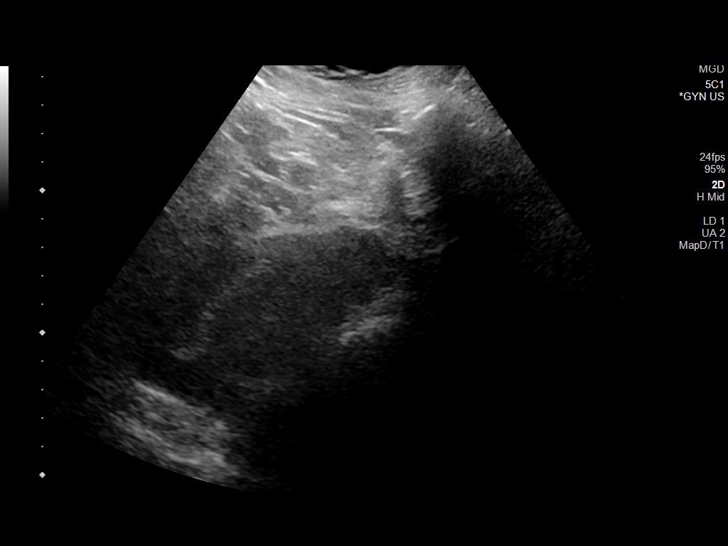
[im 6/68]
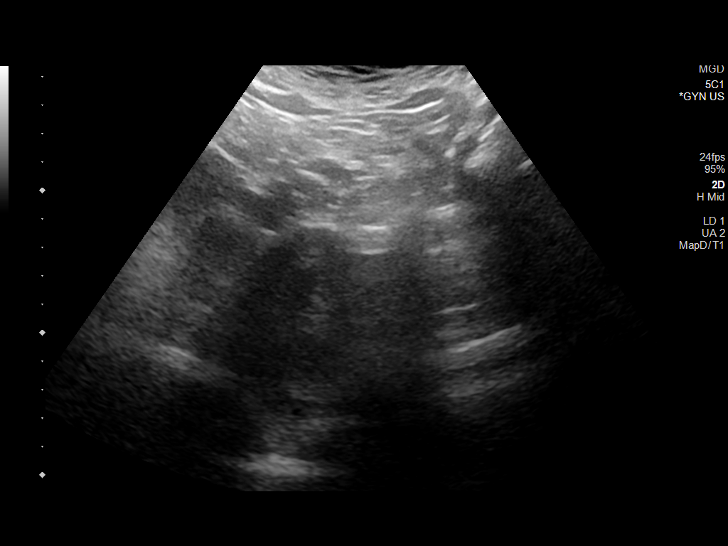
[im 12/68]
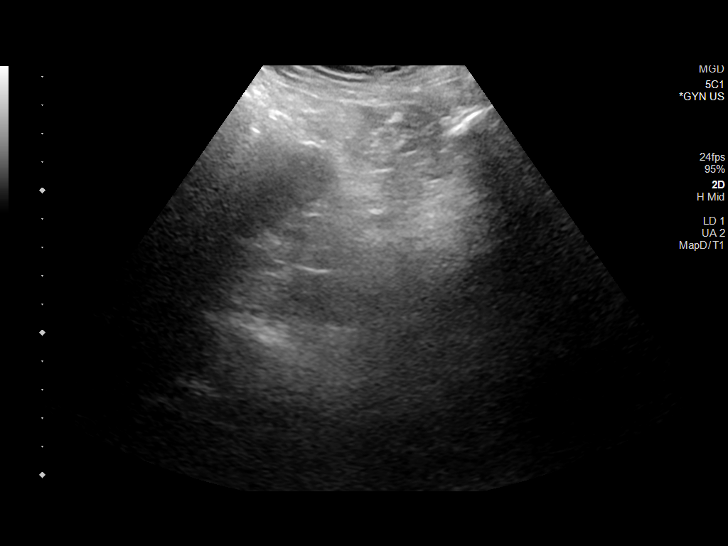
[im 17/68]
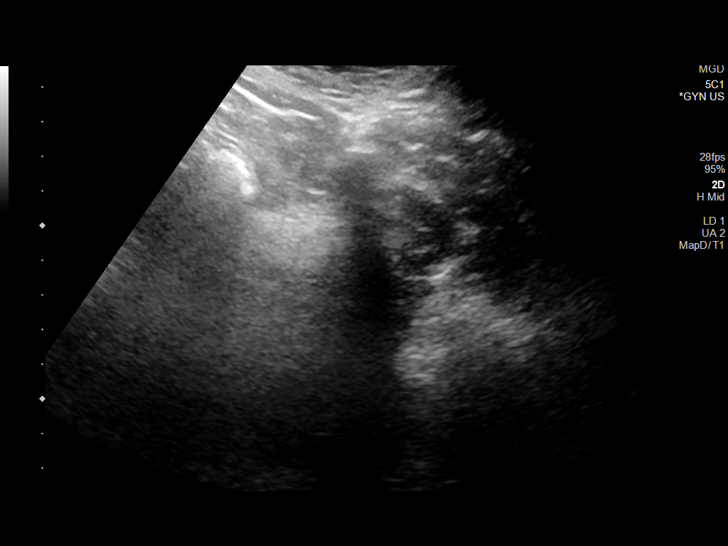
[im 23/68]
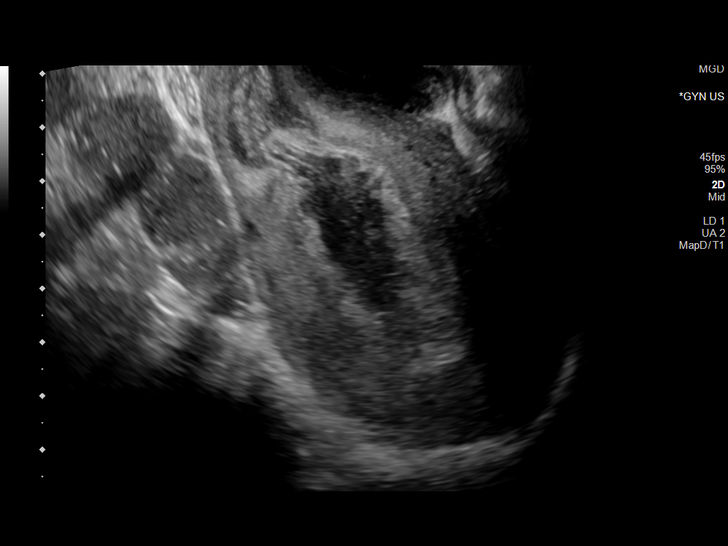
[im 28/68]
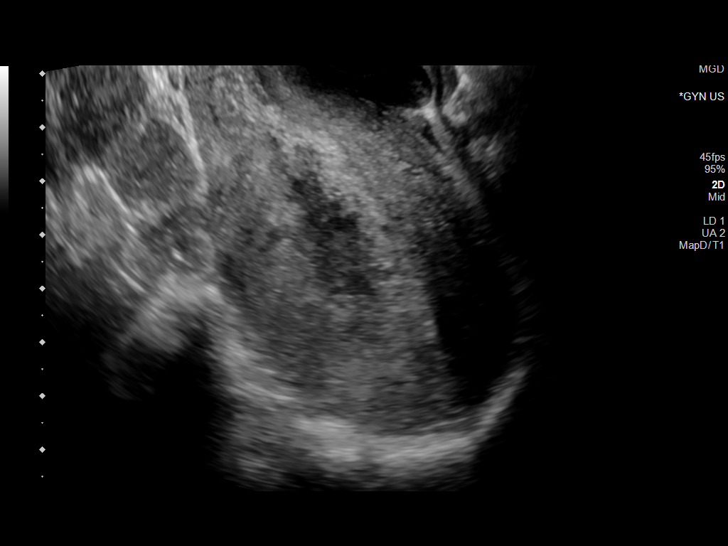
[im 34/68]
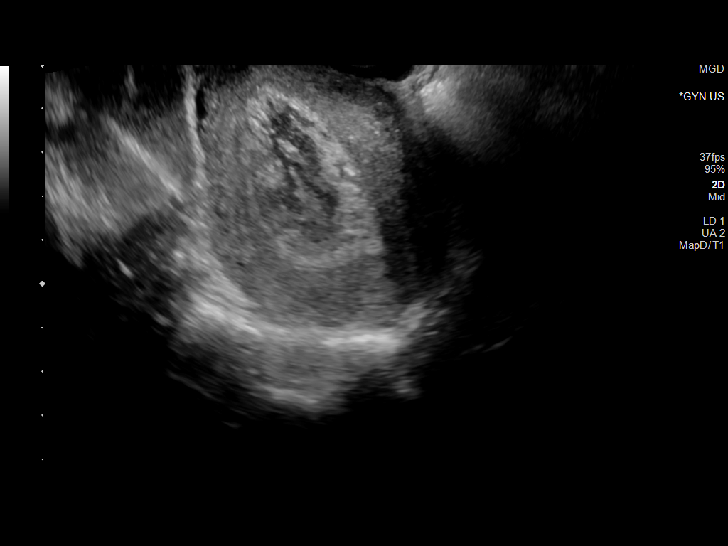
[im 40/68]
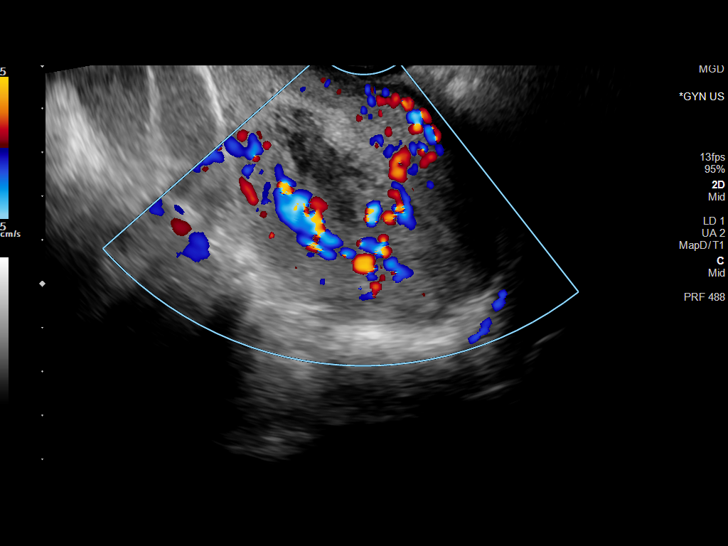
[im 45/68]
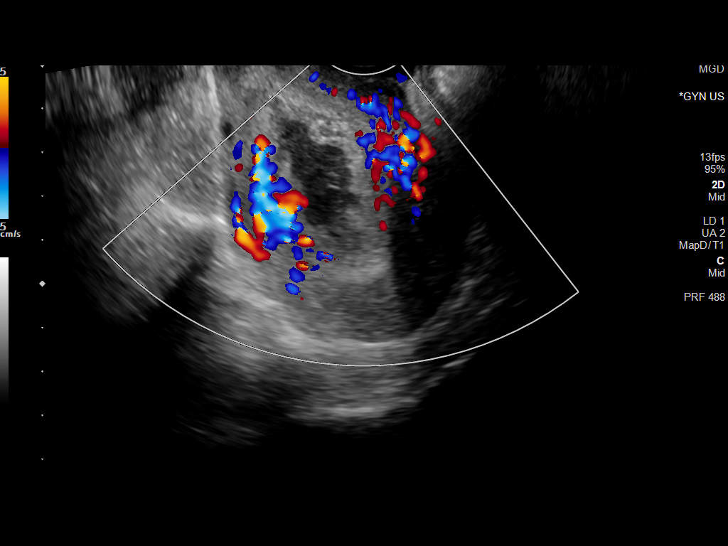
[im 51/68]
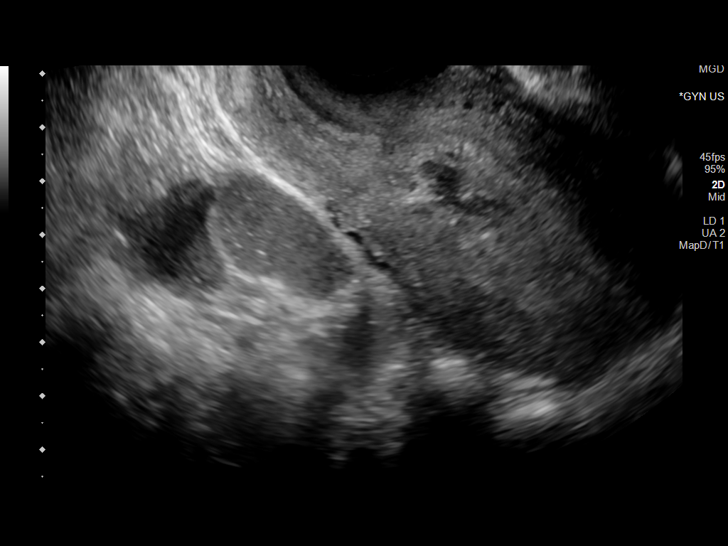
[im 56/68]
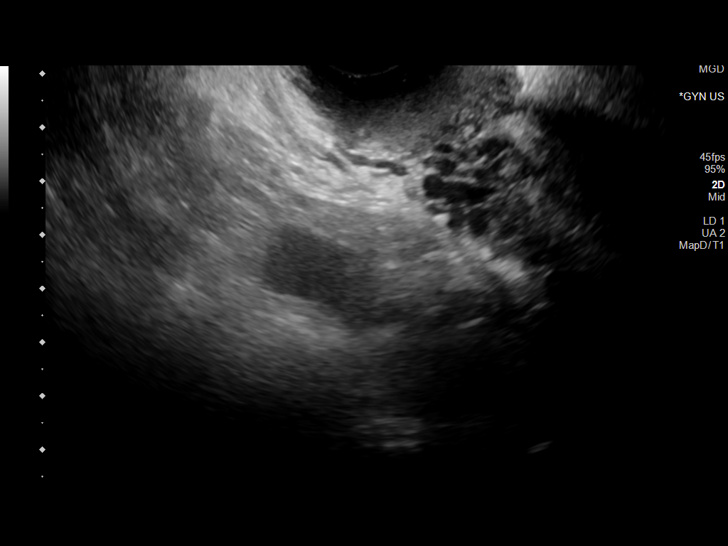
[im 62/68]
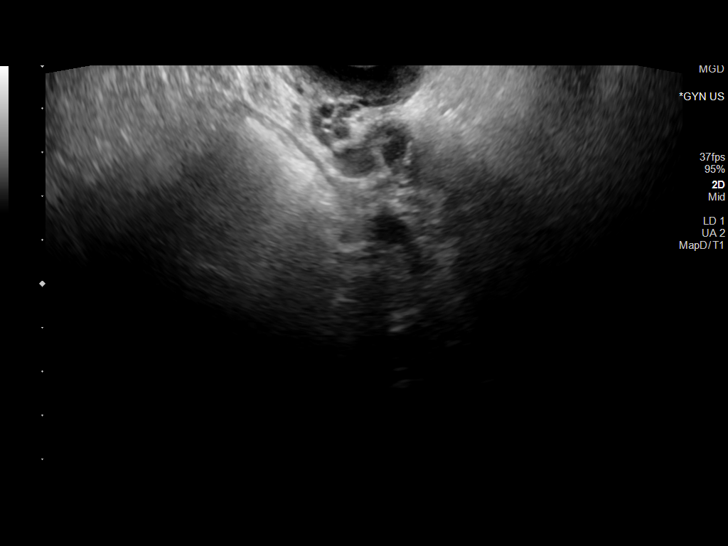
[im 68/68]
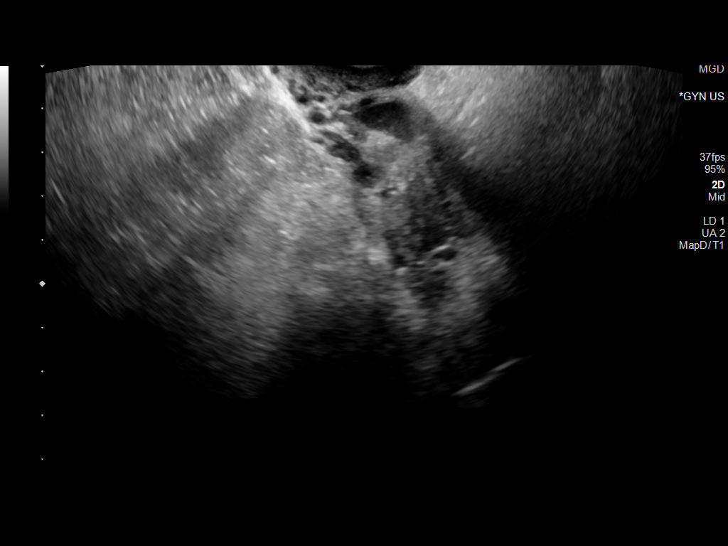

[13 of 25 positions shown; findings below may reference images not displayed]

FINDINGS: Uterus

Measurements: 9.1 x 4.6 x 4.9 cm = volume: 112 mL. The uterus is
retroverted and slightly heterogeneous.

Endometrium

Thickness: 24 mm. The endometrium is distended. Heterogeneous
predominantly hypoechoic content within the endometrium may
represent blood product. Retained product of conception is not
excluded correlation with clinical exam and HCG levels recommended.
No significant vascularity noted within the endometrial content.

Right ovary

Not visualized.

Left ovary

Not visualized.

Other findings

No abnormal free fluid.
IMPRESSION: Distended endometrium containing blood product versus possible
retained product of conception. Correlation with clinical exam and
HCG levels recommended.
